# Patient Record
Sex: Male | Born: 1965
Health system: Southern US, Community
[De-identification: ages and names within clinical notes are randomized; demographics above are authoritative.]

## PROBLEM LIST (undated history)

## (undated) DIAGNOSIS — I10 Essential (primary) hypertension: Secondary | ICD-10-CM

## (undated) DIAGNOSIS — E785 Hyperlipidemia, unspecified: Secondary | ICD-10-CM

## (undated) HISTORY — PX: DECOMPRESSION FASCIOTOMY LEG: SUR403

## (undated) HISTORY — DX: Hyperlipidemia, unspecified: E78.5

## (undated) HISTORY — PX: SHOULDER SURGERY: SHX246

---

## 1998-06-25 ENCOUNTER — Emergency Department (HOSPITAL_COMMUNITY): Admission: EM | Admit: 1998-06-25 | Discharge: 1998-06-25 | Payer: Self-pay | Admitting: Family Medicine

## 1998-08-20 ENCOUNTER — Emergency Department (HOSPITAL_COMMUNITY): Admission: EM | Admit: 1998-08-20 | Discharge: 1998-08-20 | Payer: Self-pay | Admitting: Emergency Medicine

## 1999-07-12 ENCOUNTER — Encounter: Payer: Self-pay | Admitting: Emergency Medicine

## 1999-07-12 ENCOUNTER — Emergency Department (HOSPITAL_COMMUNITY): Admission: EM | Admit: 1999-07-12 | Discharge: 1999-07-12 | Payer: Self-pay | Admitting: Emergency Medicine

## 1999-10-19 ENCOUNTER — Other Ambulatory Visit: Admission: RE | Admit: 1999-10-19 | Discharge: 1999-10-19 | Payer: Self-pay | Admitting: Urology

## 2007-02-11 ENCOUNTER — Ambulatory Visit (HOSPITAL_BASED_OUTPATIENT_CLINIC_OR_DEPARTMENT_OTHER): Admission: RE | Admit: 2007-02-11 | Discharge: 2007-02-11 | Payer: Self-pay | Admitting: Orthopedic Surgery

## 2007-02-26 ENCOUNTER — Ambulatory Visit (HOSPITAL_BASED_OUTPATIENT_CLINIC_OR_DEPARTMENT_OTHER): Admission: RE | Admit: 2007-02-26 | Discharge: 2007-02-26 | Payer: Self-pay | Admitting: Orthopedic Surgery

## 2007-05-09 ENCOUNTER — Emergency Department (HOSPITAL_COMMUNITY): Admission: EM | Admit: 2007-05-09 | Discharge: 2007-05-10 | Payer: Self-pay | Admitting: Emergency Medicine

## 2010-01-23 ENCOUNTER — Encounter: Admission: RE | Admit: 2010-01-23 | Discharge: 2010-01-23 | Payer: Self-pay | Admitting: Occupational Medicine

## 2010-01-25 ENCOUNTER — Encounter: Payer: Self-pay | Admitting: Sports Medicine

## 2010-01-31 ENCOUNTER — Ambulatory Visit: Payer: Self-pay | Admitting: Sports Medicine

## 2010-01-31 ENCOUNTER — Encounter: Payer: Self-pay | Admitting: Family Medicine

## 2010-01-31 DIAGNOSIS — M25519 Pain in unspecified shoulder: Secondary | ICD-10-CM | POA: Insufficient documentation

## 2010-04-12 ENCOUNTER — Ambulatory Visit (HOSPITAL_BASED_OUTPATIENT_CLINIC_OR_DEPARTMENT_OTHER): Admission: RE | Admit: 2010-04-12 | Discharge: 2010-04-12 | Payer: Self-pay | Admitting: Orthopedic Surgery

## 2010-12-11 NOTE — Consult Note (Signed)
Summary: [facility]  [place] [facility]   Imported By: Marily Memos 01/31/2010 14:42:12  _____________________________________________________________________  External Attachment:    Type:   Image     Comment:   External Document

## 2010-12-11 NOTE — Assessment & Plan Note (Signed)
Summary: Evan Strickland   Vital Signs:  Patient profile:   45 year old male Height:      72 inches Weight:      225.38 pounds BMI:     30.68 Pulse rate:   98 / minute BP sitting:   173 / 98  (left arm)  Vitals Entered By: Terese Door CMA (January 31, 2010 2:02 PM) CC: NP-right shoulder injury Pain Assessment Patient in pain? yes     Location: shoulder Intensity: 5 Type: aching   CC:  NP-right shoulder injury.  History of Present Illness: DOI: 01/22/2010 Employer: [employer] Current Work Status: No work over head/shoulder level; limited use of right arm.  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 Initially felt some anterio-superior right shoulder pain but continued his shift. Does not recall a popping/tearing/subluxation/dislocation sensation. Severe pain  ~2 hours later. Has tried relafen, ice, and corticosteroid injection. Pain has decreased by 50% in the interim. Occasional night pain. Pain mostly on overhead activities. Occasional transient paresthesias w/o clear moderating factors. No neck pain. No upper extremity weakness. No prior right shoulder injuries or procedures.  Outside progress notes and x-ray report reviewed during this encounter. Respective documentation submitted for scanning into the EMR.   Outside shoulder x-rays WNL per report. PMH-FH-SH reviewed for relevance  Physical Exam  General:  Well-developed,well-nourished,in no acute distress; alert,appropriate and cooperative throughout examination Msk:  NECK: FROM without pain. No ttp.  SHOULDERS: FROM with limied 120 degress right flex/abd 2/2 pain. Full PROM. No apparent deformity. (+) ttp right ac jt and posterior shoulder. (-) cross arm adduction. (+) Neer's with relief. (+) hawkins (-) empty can No labral instability but pain on testing. (+)  yerguson's.  ELBOWS/WRISTS/HANDS/FINGERS: FROM without pain. Full strength.  Pulses:  2+ radial pulses. Neurologic:  (-) spurling's bilaterally. Normal nv examination throughout the upper extremities. Additional Exam:  Musculoskeletal Ultrasound: Longitudinal and transverse views of the shoulder revealed the following:  Biceps Tendon: Normal.  AC Joint: Normal. Subscapularis: Normal except for calcification mid body on dynamic testing. No impingement. Supraspinatus: Normal. No impingement. Infraspinatus: Normal. Teres Minor: Normal. Deltoid: Normal. Humeral Head: Normal. Labrum: Normal on posterior view.  *No bursitis.   Impression & Recommendations:  Problem # 1:  SHOULDER PAIN, RIGHT (ICD-719.41) Likely RC strain. No apparent rotatory cuff tear or labral tear. s/p corticosteroid injection at outside facility.  - Continue relafen regimen. - Remain on light work duty status for the next week. - Return to regular duty once 90% improved wrt pain and functionality. - RTC as needed for pain, failure to significantly improve, or any other concerns. If persistent pain, then will consider additional imaging +/- physical therapy to further evaluate his symptoms.  Orders: US EXTREMITY NON-VASC REAL-TIME IMG [phone])

## 2010-12-11 NOTE — Letter (Signed)
Summary: Out of Work  [facility]  [address]   [place], [place] [address]   Phone: [phone]  Fax: [phone]    January 31, 2010   Employee:  [employer]    To Whom It May Concern:  Mr. Honse should remain on his current light work duty status for an additional week. He can resume regular work duty once he has acheived 90% improvement with respect to his pain and functionality. Mr. Esquivel should return to our clinic as needed for pain, failure to significantly improve, or any other concerns.  If you need additional information, please feel free to contact our office.         Sincerely,    Valarie Merino MD

## 2011-01-28 LAB — POCT HEMOGLOBIN-HEMACUE: Hemoglobin: 15.6 g/dL (ref 13.0–17.0)

## 2011-03-29 NOTE — Op Note (Signed)
NAMEDAYTONA, RETANA NO.:  [id number]   MEDICAL RECORD NO.:  [id number]          PATIENT TYPE:  AMB   LOCATION:  DSC                          FACILITY:  [facility]   PHYSICIAN:  Loreta Ave, M.D. DATE OF BIRTH:  01/08/1966   DATE OF PROCEDURE:  02/26/2007  DATE OF DISCHARGE:                               OPERATIVE REPORT   PREOPERATIVE DIAGNOSIS:  Left leg status post anterior compartment  release and decompression of peroneal nerve with postoperative hematoma.   POSTOPERATIVE DIAGNOSIS:  Left leg status post anterior compartment  release and decompression of peroneal nerve with postoperative hematoma.   PROCEDURE:  Incision, drainage, evacuation, and lavage hematoma of right  leg.   SURGEON:  Loreta Ave, M.D.   ASSISTANT:  Genene Churn. Barry Dienes, P.A.-C.   ANESTHESIA:  General.   BLOOD LOSS:  Minimal.   TOURNIQUET TIME:  30 minutes.   SPECIMENS:  None.   CULTURES:  The hematoma was sent for aerobic and anaerobic culture but  no obvious purulence was seen.   COMPLICATIONS:  None.   DRESSING:  Soft compressive.   DESCRIPTION OF PROCEDURE:  The patient was brought to the operating room  and after adequate anesthesia had been obtained, a tourniquet was  applied on the upper aspect of the right leg.  Prepped and draped in the  usual sterile fashion.  Without the tourniquet initially, I opened the  incision over the distal third of the anterior compartment.  The skin  and subcutaneous tissues were divided.  Obvious subcutaneous hematoma  evacuated there.  Once that was cleared out, there was still residual  hematoma well proximally and I made a separate longitudinal incision  over the proximal aspect of the anterior compartment near the top of  this compartment release.  The skin and subcutaneous tissues were  divided there.  Neurovascular structures were protected throughout.  Hematoma evacuated there.  To facilitate exposure and inspection, I then  used an Esmarch and inflated the tourniquet to 300 mmHg.  When that was  complete, all wounds were thoroughly identified, completely cleared out  from hematoma of all recesses.  Although this was a little bit tense  from the hematoma, once it was evacuated, all the tissues were nice and  flexible.  No extension into any of the other compartments.  The  musculature in the anterior compartment looked excellent and viable  without any evidence of necrosis.  At no point was there any purulence.  The peroneal nerve, which had been decompressed distally, was still in  good condition.  Thorough irrigation with pulse lavage.  The wounds were then approximated with Vicryl and staples.  A sterile  compressive dressing was applied.  Tourniquet deflated and removed.  Anesthesia reversed.  Brought to the recovery room.  Tolerated surgery  well with no complications.      Loreta Ave, M.D.  Electronically Signed     DFM/MEDQ  D:  02/26/2007  T:  02/26/2007  Job:  [id number]

## 2011-03-29 NOTE — Op Note (Signed)
NAMEISAIAHS, CHANCY NO.:  [id number]   MEDICAL RECORD NO.:  [id number]          PATIENT TYPE:  AMB   LOCATION:  DSC                          FACILITY:  [facility]   PHYSICIAN:  Loreta Ave, M.D. DATE OF BIRTH:  October 14, 1966   DATE OF PROCEDURE:  02/11/2007  DATE OF DISCHARGE:                               OPERATIVE REPORT   PREOPERATIVE DIAGNOSIS:  Symptomatic post-traumatic fascial hernia,  anterior lateral compartments, lower aspect, right leg.   POSTOPERATIVE DIAGNOSIS:  Traumatic fascial hernia lateral compartment  right leg with partial entrapment of peroneal nerve superficial branch  through the hernia.   PROCEDURE:  Exploration right leg with release of superficial branch of  peroneal nerve.  Fasciotomy lateral compartment proximally and distally.   SURGEON:  Loreta Ave, M.D.   ASSISTANT:  Zonia Kief, PA   ANESTHESIA:  General.   BLOOD LOSS:  Minimal.   SPECIMENS:  None.   CULTURES:  None.   COMPLICATIONS:  None.   DRESSING:  Soft compressive.   TOURNIQUET TIME:  40 minutes.   PROCEDURE:  The patient brought to the operating room, placed on  operating table in supine position.  After adequate anesthesia had been  obtained,  Tourniquet applied upper aspect, right leg.  Prepped and  draped in the usual sterile fashion.  Exsanguinated with elevation and  Esmarch.  Tourniquet inflated to 350 mm mercury.  Longitudinal incision junction  of the middle and distal third of the leg centered over the  intramuscular septum just lateral to top of the fascial defect.  Skin  and subcutaneous tissue divided.  Defect was evident almost immediately  about 5 to 6 mm long with soft tissue swelling in the defect.  It was  just posterior to the inner muscular septum.  Superficial branch of  peroneal nerve actually had herniated through this up and then back down  with some scarring and inflammatory tissue around this.  Nerve  identified and protected  throughout.  I then carefully exposed the  fascia above and below, went proximal and distal and did a complete  release of lateral compartment with direct visualization in a  subcutaneous manner well proximal and distally out the bottom of the  compartment.  Nerve protected throughout.  Thoroughly decompressed  throughout.  There was no need to release the anterior compartment as it  was not involved.  Wound irrigated.  Skin was closed with  subcutaneous and subcuticular Vicryl.  Margins of wound injected with  Marcaine.  Sterile compressive dressing applied.  Compressive hose  applied.  Tourniquet deflated and removed.  Anesthesia reversed.  Brought to recovery room.  Tolerated surgery well.  No complications.      Loreta Ave, M.D.  Electronically Signed     DFM/MEDQ  D:  02/11/2007  T:  02/11/2007  Job:  [id number]

## 2013-04-01 ENCOUNTER — Encounter (HOSPITAL_COMMUNITY): Payer: Self-pay | Admitting: Emergency Medicine

## 2013-04-01 ENCOUNTER — Encounter (HOSPITAL_COMMUNITY): Payer: Self-pay | Admitting: Anesthesiology

## 2013-04-01 ENCOUNTER — Encounter (HOSPITAL_COMMUNITY)
Admission: EM | Disposition: A | Discharge status: Home / Self Care | Payer: Self-pay | Source: Home / Self Care | Attending: Emergency Medicine

## 2013-04-01 ENCOUNTER — Emergency Department (HOSPITAL_COMMUNITY): Payer: 59 | Admitting: Anesthesiology

## 2013-04-01 ENCOUNTER — Emergency Department (HOSPITAL_COMMUNITY): Payer: 59

## 2013-04-01 ENCOUNTER — Observation Stay (HOSPITAL_COMMUNITY)
Admission: EM | Admit: 2013-04-01 | Discharge: 2013-04-02 | Disposition: A | Discharge status: Home / Self Care | Payer: 59 | Attending: General Surgery | Admitting: General Surgery

## 2013-04-01 DIAGNOSIS — K3580 Unspecified acute appendicitis: Principal | ICD-10-CM | POA: Diagnosis present

## 2013-04-01 DIAGNOSIS — I10 Essential (primary) hypertension: Secondary | ICD-10-CM | POA: Insufficient documentation

## 2013-04-01 DIAGNOSIS — R1033 Periumbilical pain: Secondary | ICD-10-CM | POA: Insufficient documentation

## 2013-04-01 DIAGNOSIS — K37 Unspecified appendicitis: Secondary | ICD-10-CM

## 2013-04-01 DIAGNOSIS — R1031 Right lower quadrant pain: Secondary | ICD-10-CM | POA: Insufficient documentation

## 2013-04-01 HISTORY — PX: APPENDECTOMY: SHX54

## 2013-04-01 HISTORY — PX: LAPAROSCOPIC APPENDECTOMY: SHX408

## 2013-04-01 HISTORY — DX: Essential (primary) hypertension: I10

## 2013-04-01 LAB — CBC WITH DIFFERENTIAL/PLATELET
Basophils Absolute: 0 10*3/uL (ref 0.0–0.1)
Basophils Relative: 0 % (ref 0–1)
Eosinophils Absolute: 0.1 10*3/uL (ref 0.0–0.7)
Eosinophils Relative: 1 % (ref 0–5)
HCT: 42.8 % (ref 39.0–52.0)
Hemoglobin: 15.1 g/dL (ref 13.0–17.0)
Lymphocytes Relative: 12 % (ref 12–46)
Lymphs Abs: 1.4 10*3/uL (ref 0.7–4.0)
MCH: 31.7 pg (ref 26.0–34.0)
MCHC: 35.3 g/dL (ref 30.0–36.0)
MCV: 89.7 fL (ref 78.0–100.0)
Monocytes Absolute: 1 10*3/uL (ref 0.1–1.0)
Monocytes Relative: 9 % (ref 3–12)
Neutro Abs: 9.1 10*3/uL — ABNORMAL HIGH (ref 1.7–7.7)
Neutrophils Relative %: 79 % — ABNORMAL HIGH (ref 43–77)
Platelets: 163 10*3/uL (ref 150–400)
RBC: 4.77 MIL/uL (ref 4.22–5.81)
RDW: 12.3 % (ref 11.5–15.5)
WBC: 11.6 10*3/uL — ABNORMAL HIGH (ref 4.0–10.5)

## 2013-04-01 LAB — COMPREHENSIVE METABOLIC PANEL
ALT: 41 U/L (ref 0–53)
AST: 35 U/L (ref 0–37)
Albumin: 4.5 g/dL (ref 3.5–5.2)
Alkaline Phosphatase: 48 U/L (ref 39–117)
BUN: 17 mg/dL (ref 6–23)
CO2: 22 mEq/L (ref 19–32)
Calcium: 10 mg/dL (ref 8.4–10.5)
Chloride: 97 mEq/L (ref 96–112)
Creatinine, Ser: 0.94 mg/dL (ref 0.50–1.35)
GFR calc Af Amer: >90 mL/min (ref >90)
GFR calc non Af Amer: >90 mL/min (ref >90)
Glucose, Bld: 108 mg/dL — ABNORMAL HIGH (ref 70–99)
Potassium: 4 mEq/L (ref 3.5–5.1)
Sodium: 135 mEq/L (ref 135–145)
Total Bilirubin: 0.9 mg/dL (ref 0.3–1.2)
Total Protein: 7.7 g/dL (ref 6.0–8.3)

## 2013-04-01 LAB — URINALYSIS, ROUTINE W REFLEX MICROSCOPIC
Bilirubin Urine: NEGATIVE
Glucose, UA: NEGATIVE mg/dL
Hgb urine dipstick: NEGATIVE
Specific Gravity, Urine: 1.019 (ref 1.005–1.030)
pH: 8 (ref 5.0–8.0)

## 2013-04-01 SURGERY — APPENDECTOMY, LAPAROSCOPIC
Anesthesia: General | Site: Abdomen | Wound class: Clean Contaminated

## 2013-04-01 MED ORDER — MIDAZOLAM HCL 5 MG/5ML IJ SOLN
INTRAMUSCULAR | Status: DC | PRN
  Administered 2013-04-01: 2 mg via INTRAVENOUS

## 2013-04-01 MED ORDER — HYDROMORPHONE HCL PF 1 MG/ML IJ SOLN
INTRAMUSCULAR | Status: AC
  Filled 2013-04-01: qty 1

## 2013-04-01 MED ORDER — SODIUM CHLORIDE 0.9 % IV SOLN
INTRAVENOUS | Status: DC
  Administered 2013-04-01: 18:00:00 via INTRAVENOUS

## 2013-04-01 MED ORDER — BENAZEPRIL HCL 10 MG PO TABS
10.0000 mg | ORAL_TABLET | Freq: Every day | ORAL | Status: DC
  Administered 2013-04-02: 10 mg via ORAL
  Filled 2013-04-01: qty 1

## 2013-04-01 MED ORDER — SODIUM CHLORIDE 0.9 % IR SOLN
Status: DC | PRN
  Administered 2013-04-01: 1000 mL

## 2013-04-01 MED ORDER — MORPHINE SULFATE 4 MG/ML IJ SOLN
4.0000 mg | Freq: Once | INTRAMUSCULAR | Status: AC
  Administered 2013-04-01: 4 mg via INTRAVENOUS
  Filled 2013-04-01: qty 1

## 2013-04-01 MED ORDER — FENTANYL CITRATE 0.05 MG/ML IJ SOLN
50.0000 ug | Freq: Once | INTRAMUSCULAR | Status: AC
  Administered 2013-04-01: 50 ug via INTRAVENOUS
  Filled 2013-04-01: qty 2

## 2013-04-01 MED ORDER — DEXTROSE 5 % IV SOLN
2.0000 g | INTRAVENOUS | Status: AC
  Administered 2013-04-01: 2 g via INTRAVENOUS
  Filled 2013-04-01: qty 2

## 2013-04-01 MED ORDER — GLYCOPYRROLATE 0.2 MG/ML IJ SOLN
INTRAMUSCULAR | Status: DC | PRN
  Administered 2013-04-01: .8 mg via INTRAVENOUS

## 2013-04-01 MED ORDER — MORPHINE SULFATE 2 MG/ML IJ SOLN
1.0000 mg | INTRAMUSCULAR | Status: DC | PRN
  Administered 2013-04-01: 2 mg via INTRAVENOUS
  Filled 2013-04-01: qty 1

## 2013-04-01 MED ORDER — DEXAMETHASONE SODIUM PHOSPHATE 4 MG/ML IJ SOLN
INTRAMUSCULAR | Status: DC | PRN
  Administered 2013-04-01: 4 mg via INTRAVENOUS

## 2013-04-01 MED ORDER — PROPOFOL 10 MG/ML IV BOLUS
INTRAVENOUS | Status: DC | PRN
  Administered 2013-04-01: 200 mg via INTRAVENOUS

## 2013-04-01 MED ORDER — OXYCODONE HCL 5 MG PO TABS
5.0000 mg | ORAL_TABLET | ORAL | Status: DC | PRN
  Administered 2013-04-01 – 2013-04-02 (×3): 5 mg via ORAL
  Filled 2013-04-01 (×3): qty 1

## 2013-04-01 MED ORDER — ONDANSETRON HCL 4 MG/2ML IJ SOLN
4.0000 mg | Freq: Once | INTRAMUSCULAR | Status: AC
  Administered 2013-04-01: 4 mg via INTRAVENOUS
  Filled 2013-04-01: qty 2

## 2013-04-01 MED ORDER — IOHEXOL 300 MG/ML  SOLN
100.0000 mL | Freq: Once | INTRAMUSCULAR | Status: AC | PRN
  Administered 2013-04-01: 100 mL via INTRAVENOUS

## 2013-04-01 MED ORDER — NEOSTIGMINE METHYLSULFATE 1 MG/ML IJ SOLN
INTRAMUSCULAR | Status: DC | PRN
  Administered 2013-04-01: 5 mg via INTRAVENOUS

## 2013-04-01 MED ORDER — ACETAMINOPHEN 325 MG PO TABS
650.0000 mg | ORAL_TABLET | Freq: Four times a day (QID) | ORAL | Status: DC | PRN
  Administered 2013-04-01: 650 mg via ORAL
  Filled 2013-04-01: qty 2

## 2013-04-01 MED ORDER — LACTATED RINGERS IV SOLN
INTRAVENOUS | Status: DC | PRN
  Administered 2013-04-01: 16:00:00 via INTRAVENOUS

## 2013-04-01 MED ORDER — MORPHINE SULFATE 2 MG/ML IJ SOLN
2.0000 mg | INTRAMUSCULAR | Status: DC | PRN
  Administered 2013-04-01 – 2013-04-02 (×4): 2 mg via INTRAVENOUS
  Filled 2013-04-01 (×4): qty 1

## 2013-04-01 MED ORDER — LIDOCAINE HCL (CARDIAC) 20 MG/ML IV SOLN
INTRAVENOUS | Status: DC | PRN
  Administered 2013-04-01: 60 mg via INTRAVENOUS

## 2013-04-01 MED ORDER — ONDANSETRON HCL 4 MG/2ML IJ SOLN
4.0000 mg | INTRAMUSCULAR | Status: DC | PRN
  Administered 2013-04-01: 4 mg via INTRAVENOUS
  Filled 2013-04-01: qty 2

## 2013-04-01 MED ORDER — FENTANYL CITRATE 0.05 MG/ML IJ SOLN
INTRAMUSCULAR | Status: DC | PRN
  Administered 2013-04-01: 50 ug via INTRAVENOUS
  Administered 2013-04-01: 100 ug via INTRAVENOUS
  Administered 2013-04-01 (×2): 50 ug via INTRAVENOUS

## 2013-04-01 MED ORDER — IOHEXOL 300 MG/ML  SOLN
25.0000 mL | INTRAMUSCULAR | Status: DC
  Administered 2013-04-01: 25 mL via ORAL

## 2013-04-01 MED ORDER — DEXTROSE 5 % IV SOLN
2.0000 g | Freq: Four times a day (QID) | INTRAVENOUS | Status: AC
  Administered 2013-04-01 – 2013-04-02 (×2): 2 g via INTRAVENOUS
  Filled 2013-04-01 (×2): qty 2

## 2013-04-01 MED ORDER — ROCURONIUM BROMIDE 100 MG/10ML IV SOLN
INTRAVENOUS | Status: DC | PRN
  Administered 2013-04-01: 30 mg via INTRAVENOUS

## 2013-04-01 MED ORDER — HYDROMORPHONE HCL PF 1 MG/ML IJ SOLN
0.2500 mg | INTRAMUSCULAR | Status: DC | PRN
  Administered 2013-04-01 (×4): 0.5 mg via INTRAVENOUS

## 2013-04-01 MED ORDER — BUPIVACAINE-EPINEPHRINE 0.25% -1:200000 IJ SOLN
INTRAMUSCULAR | Status: DC | PRN
  Administered 2013-04-01: 30 mL

## 2013-04-01 MED ORDER — ACETAMINOPHEN 650 MG RE SUPP: 650.0000 mg | Freq: Four times a day (QID) | RECTAL | Status: DC | PRN

## 2013-04-01 MED ORDER — ONDANSETRON HCL 4 MG/2ML IJ SOLN: 4.0000 mg | Freq: Four times a day (QID) | INTRAMUSCULAR | Status: DC | PRN

## 2013-04-01 MED ORDER — SUCCINYLCHOLINE CHLORIDE 20 MG/ML IJ SOLN
INTRAMUSCULAR | Status: DC | PRN
  Administered 2013-04-01: 100 mg via INTRAVENOUS

## 2013-04-01 SURGICAL SUPPLY — 38 items
APPLIER CLIP ROT 10 11.4 M/L (STAPLE)
CANISTER SUCTION 2500CC (MISCELLANEOUS) ×2 IMPLANT
CHLORAPREP W/TINT 26ML (MISCELLANEOUS) ×2 IMPLANT
CLIP APPLIE ROT 10 11.4 M/L (STAPLE) IMPLANT
CLOTH BEACON ORANGE TIMEOUT ST (SAFETY) ×2 IMPLANT
COVER SURGICAL LIGHT HANDLE (MISCELLANEOUS) ×2 IMPLANT
CUTTER FLEX LINEAR 45M (STAPLE) ×2 IMPLANT
DERMABOND ADVANCED (GAUZE/BANDAGES/DRESSINGS) ×1
DERMABOND ADVANCED .7 DNX12 (GAUZE/BANDAGES/DRESSINGS) ×1 IMPLANT
DRAPE UTILITY 15X26 W/TAPE STR (DRAPE) ×4 IMPLANT
ELECT REM PT RETURN 9FT ADLT (ELECTROSURGICAL) ×2
ELECTRODE REM PT RTRN 9FT ADLT (ELECTROSURGICAL) ×1 IMPLANT
GLOVE BIO SURGEON STRL SZ7 (GLOVE) ×2 IMPLANT
GLOVE BIOGEL PI IND STRL 7.0 (GLOVE) ×2 IMPLANT
GLOVE BIOGEL PI IND STRL 7.5 (GLOVE) ×1 IMPLANT
GLOVE BIOGEL PI INDICATOR 7.0 (GLOVE) ×2
GLOVE BIOGEL PI INDICATOR 7.5 (GLOVE) ×1
GOWN STRL NON-REIN LRG LVL3 (GOWN DISPOSABLE) ×6 IMPLANT
KIT BASIN OR (CUSTOM PROCEDURE TRAY) ×2 IMPLANT
KIT ROOM TURNOVER OR (KITS) ×2 IMPLANT
NS IRRIG 1000ML POUR BTL (IV SOLUTION) ×2 IMPLANT
PAD ARMBOARD 7.5X6 YLW CONV (MISCELLANEOUS) ×4 IMPLANT
POUCH SPECIMEN RETRIEVAL 10MM (ENDOMECHANICALS) ×2 IMPLANT
RELOAD 45 VASCULAR/THIN (ENDOMECHANICALS) ×2 IMPLANT
RELOAD STAPLE TA45 3.5 REG BLU (ENDOMECHANICALS) ×2 IMPLANT
SCALPEL HARMONIC ACE (MISCELLANEOUS) ×2 IMPLANT
SCISSORS LAP 5X35 DISP (ENDOMECHANICALS) IMPLANT
SET IRRIG TUBING LAPAROSCOPIC (IRRIGATION / IRRIGATOR) ×2 IMPLANT
SLEEVE ENDOPATH XCEL 5M (ENDOMECHANICALS) ×2 IMPLANT
SPECIMEN JAR SMALL (MISCELLANEOUS) ×2 IMPLANT
SUT MNCRL AB 4-0 PS2 18 (SUTURE) ×4 IMPLANT
SUT VICRYL 0 UR6 27IN ABS (SUTURE) ×2 IMPLANT
TOWEL OR 17X24 6PK STRL BLUE (TOWEL DISPOSABLE) ×2 IMPLANT
TOWEL OR 17X26 10 PK STRL BLUE (TOWEL DISPOSABLE) ×2 IMPLANT
TRAY FOLEY CATH 14FR (SET/KITS/TRAYS/PACK) ×2 IMPLANT
TRAY LAPAROSCOPIC (CUSTOM PROCEDURE TRAY) ×2 IMPLANT
TROCAR XCEL BLUNT TIP 100MML (ENDOMECHANICALS) ×2 IMPLANT
TROCAR XCEL NON-BLD 5MMX100MML (ENDOMECHANICALS) ×2 IMPLANT

## 2013-04-01 NOTE — ED Notes (Signed)
Pt made aware of need for urine sample.  

## 2013-04-01 NOTE — H&P (Signed)
Evan Strickland [birth year]  [id number].   Chief Complaint/Reason for Consult: acute appendicitis HPI: This is a 47 yo male with HTN who began having periumbilical pain that woke him up last night.  Throughout the night the patient noticed that his pain was migrating into his RLQ.  He had some mild nausea this morning, but no emesis or diarrhea.  He denies any fevers.  He came to [facility] where he had a CT scan that revealed acute appendicitis.  We have been called to see him.   Review of Systems: Please see HPI, otherwise all other systems have been reviewed and are negative.  History reviewed. No pertinent family history.  Past Medical History  Diagnosis Date  . Hypertension     Past Surgical History  Procedure Laterality Date  . Shoulder surgery Bilateral   . Decompression fasciotomy leg Right     Social History:  reports that he has never smoked. He does not have any smokeless tobacco history on file. He reports that  drinks alcohol. He reports that he does not use illicit drugs.  Allergies: No Known Allergies   (Not in a hospital admission)  Blood pressure 119/80, pulse 72, temperature 97.7 F (36.5 C), temperature source Oral, resp. rate 18, SpO2 97.00%. Physical Exam: General: pleasant, WD, WN white male who is laying in bed in NAD HEENT: head is normocephalic, atraumatic.  Sclera are noninjected.  PERRL.  Ears and nose without any masses or lesions.  Mouth is pink and moist Heart: regular, rate, and rhythm.  Normal s1,s2. No obvious murmurs, gallops, or rubs noted.  Palpable radial and pedal pulses bilaterally Lungs: CTAB, no wheezes, rhonchi, or rales noted.  Respiratory effort nonlabored Abd: soft, tender in RLQ at McBurney's point, ND, +BS, no masses, hernias, or organomegaly MS: all 4 extremities are symmetrical with no cyanosis, clubbing, or edema. Scars noted on his shoulders as well as his right lower leg. Skin: warm and dry with no masses, lesions, or rashes Psych: A&Ox3  with an appropriate affect.    Results for orders placed during the hospital encounter of 04/01/13 (from the past 48 hour(s))  CBC WITH DIFFERENTIAL     Status: Abnormal   Collection Time    04/01/13 11:55 AM      Result Value Range   WBC 11.6 (*) 4.0 - 10.5 K/uL   RBC 4.77  4.22 - 5.81 MIL/uL   Hemoglobin 15.1  13.0 - 17.0 g/dL   HCT 10.2  72.5 - 36.6 %   MCV 89.7  78.0 - 100.0 fL   MCH 31.7  26.0 - 34.0 pg   MCHC 35.3  30.0 - 36.0 g/dL   RDW 44.0  34.7 - 42.5 %   Platelets 163  150 - 400 K/uL   Neutrophils Relative % 79 (*) 43 - 77 %   Neutro Abs 9.1 (*) 1.7 - 7.7 K/uL   Lymphocytes Relative 12  12 - 46 %   Lymphs Abs 1.4  0.7 - 4.0 K/uL   Monocytes Relative 9  3 - 12 %   Monocytes Absolute 1.0  0.1 - 1.0 K/uL   Eosinophils Relative 1  0 - 5 %   Eosinophils Absolute 0.1  0.0 - 0.7 K/uL   Basophils Relative 0  0 - 1 %   Basophils Absolute 0.0  0.0 - 0.1 K/uL  COMPREHENSIVE METABOLIC PANEL     Status: Abnormal   Collection Time    04/01/13 11:55 AM  Result Value Range   Sodium 135  135 - 145 mEq/L   Potassium 4.0  3.5 - 5.1 mEq/L   Chloride 97  96 - 112 mEq/L   CO2 22  19 - 32 mEq/L   Glucose, Bld 108 (*) 70 - 99 mg/dL   BUN 17  6 - 23 mg/dL   Creatinine, Ser 9.14  0.50 - 1.35 mg/dL   Calcium 78.2  8.4 - 95.6 mg/dL   Total Protein 7.7  6.0 - 8.3 g/dL   Albumin 4.5  3.5 - 5.2 g/dL   AST 35  0 - 37 U/L   ALT 41  0 - 53 U/L   Alkaline Phosphatase 48  39 - 117 U/L   Total Bilirubin 0.9  0.3 - 1.2 mg/dL   GFR calc non Af Amer >90  >90 mL/min   GFR calc Af Amer >90  >90 mL/min   Comment:            The eGFR has been calculated     using the CKD EPI equation.     This calculation has not been     validated in all clinical     situations.     eGFR's persistently     <90 mL/min signify     possible Chronic Kidney Disease.  URINALYSIS, ROUTINE W REFLEX MICROSCOPIC     Status: Abnormal   Collection Time    04/01/13  1:09 PM      Result Value Range   Color, Urine  YELLOW  YELLOW   APPearance CLEAR  CLEAR   Specific Gravity, Urine 1.019  1.005 - 1.030   pH 8.0  5.0 - 8.0   Glucose, UA NEGATIVE  NEGATIVE mg/dL   Hgb urine dipstick NEGATIVE  NEGATIVE   Bilirubin Urine NEGATIVE  NEGATIVE   Ketones, ur 15 (*) NEGATIVE mg/dL   Protein, ur NEGATIVE  NEGATIVE mg/dL   Urobilinogen, UA 0.2  0.0 - 1.0 mg/dL   Nitrite NEGATIVE  NEGATIVE   Leukocytes, UA NEGATIVE  NEGATIVE   Comment: MICROSCOPIC NOT DONE ON URINES WITH NEGATIVE PROTEIN, BLOOD, LEUKOCYTES, NITRITE, OR GLUCOSE <1000 mg/dL.   Ct Abdomen Pelvis W Contrast  04/01/2013   *RADIOLOGY REPORT*  Clinical Data: Worsening abdominal pain radiating to back now with a sharp right lower quadrant pain and nausea question appendicitis  CT ABDOMEN AND PELVIS WITH CONTRAST  Technique:  Multidetector CT imaging of the abdomen and pelvis was performed following the standard protocol during bolus administration of intravenous contrast. Sagittal and coronal MPR images reconstructed from axial data set.  Contrast: OMNIPAQUE IOHEXOL 300 MG/ML  SOLN Dilute oral contrast.  Comparison: None  Findings: Lung bases clear. Liver, spleen, pancreas, kidneys, and adrenal glands normal appearance. Appendix demonstrates a slightly thickened wall with increased enhancement with mild surrounding periappendiceal inflammatory changes compatible with acute appendicitis. No evidence of perforation or abscess. Appendix lies immediately anterior to the right psoas muscle.  Few sigmoid diverticula noted. Otherwise normal appearance of stomach and bowel loops. No mass, adenopathy, free fluid, or free air. No hernia or acute bony lesion.  IMPRESSION: Uncomplicated acute appendicitis. Minimal sigmoid diverticulosis.  Findings called to Dr. Anitra Lauth on 04/01/2013 at 1405 hours.   Original Report Authenticated By: Ulyses Southward, M.D.       Assessment/Plan 1. Acute appendicitis 2. HTN  Plan: 1. The patient has acute appendicitis and will need to  go to the operating room for an appendectomy.  The procedure along with risks,  complications, and expected recovery has been thoroughly explained to the patient by myself as well as Dr. Dwain Sarna.  He is agreeable to proceed to the operating room.  Lelend Heinecke E 04/01/2013, 3:17 PM Pager: [phone]

## 2013-04-01 NOTE — ED Notes (Signed)
Pt finished drinking 1st cup of contrast. CT notified. 

## 2013-04-01 NOTE — Anesthesia Postprocedure Evaluation (Signed)
  Anesthesia Post-op Note  Patient: Evan Strickland  Procedure(s) Performed: Procedure(s): APPENDECTOMY LAPAROSCOPIC (N/A)  Patient Location: PACU  Anesthesia Type:General  Level of Consciousness: awake  Airway and Oxygen Therapy: Patient Spontanous Breathing  Post-op Pain: mild  Post-op Assessment: Post-op Vital signs reviewed  Post-op Vital Signs: Reviewed  Complications: No apparent anesthesia complications

## 2013-04-01 NOTE — Transfer of Care (Signed)
Immediate Anesthesia Transfer of Care Note  Patient: Evan Strickland  Procedure(s) Performed: Procedure(s): APPENDECTOMY LAPAROSCOPIC (N/A)  Patient Location: PACU  Anesthesia Type:General  Level of Consciousness: awake, alert  and oriented  Airway & Oxygen Therapy: Patient Spontanous Breathing and Patient connected to nasal cannula oxygen  Post-op Assessment: Report given to PACU RN  Post vital signs: Reviewed and stable  Complications: No apparent anesthesia complications

## 2013-04-01 NOTE — Anesthesia Preprocedure Evaluation (Signed)
Anesthesia Evaluation  Patient identified by MRN, date of birth, ID band Patient awake    Reviewed: Allergy & Precautions, H&P , NPO status , Patient's Chart, lab work & pertinent test results  Airway Mallampati: II      Dental   Pulmonary          Cardiovascular hypertension, Pt. on medications     Neuro/Psych    GI/Hepatic Neg liver ROS, History of abdominal pain. CE   Endo/Other  negative endocrine ROS  Renal/GU negative Renal ROS     Musculoskeletal   Abdominal   Peds  Hematology   Anesthesia Other Findings   Reproductive/Obstetrics                           Anesthesia Physical Anesthesia Plan  ASA: II  Anesthesia Plan: General   Post-op Pain Management:    Induction: Intravenous  Airway Management Planned: Oral ETT  Additional Equipment:   Intra-op Plan:   Post-operative Plan: Extubation in OR  Informed Consent: I have reviewed the patients History and Physical, chart, labs and discussed the procedure including the risks, benefits and alternatives for the proposed anesthesia with the patient or authorized representative who has indicated his/her understanding and acceptance.   Dental advisory given  Plan Discussed with: CRNA, Anesthesiologist and Surgeon  Anesthesia Plan Comments:         Anesthesia Quick Evaluation

## 2013-04-01 NOTE — ED Notes (Signed)
Pt returned from radiology.

## 2013-04-01 NOTE — ED Provider Notes (Signed)
History     CSN: [id number]  Arrival date & time 04/01/13  1133   First MD Initiated Contact with Patient 04/01/13 1138      Chief Complaint  Patient presents with  . Abdominal Pain    (Consider location/radiation/quality/duration/timing/severity/associated sxs/prior treatment) HPI Comments: Patient stated some mild stomach upset yesterday but no true pain until 5 AM this morning which started out as a dull ache and became more severe once he arrived at work. The pain initially started in the periumbilical region and moved to the right lower quadrant. No radiation of pain to the back.  Denies any urinary symptoms, hematuria, vomiting or diarrhea. Does complain of nausea which started in the last few hours. No prior surgical history.  Patient is a 47 y.o. male presenting with abdominal pain. The history is provided by the patient.  Abdominal Pain This is a new problem. The current episode started 12 to 24 hours ago. The problem occurs constantly. The problem has been gradually worsening. Associated symptoms include abdominal pain. The symptoms are aggravated by walking and bending. Relieved by: Lying still. He has tried nothing for the symptoms. The treatment provided no relief.    Past Medical History  Diagnosis Date  . Hypertension     Past Surgical History  Procedure Laterality Date  . Shoulder surgery Bilateral     No family history on file.  History  Substance Use Topics  . Smoking status: Not on file  . Smokeless tobacco: Not on file  . Alcohol Use: Not on file      Review of Systems  Constitutional: Negative for fever.  Gastrointestinal: Positive for nausea and abdominal pain. Negative for vomiting, diarrhea and constipation.  Genitourinary: Negative for dysuria, frequency and hematuria.  All other systems reviewed and are negative.    Allergies  Review of patient's allergies indicates no known allergies.  Home Medications  No current outpatient  prescriptions on file.  BP 143/99  Pulse 88  Temp(Src) 97.7 F (36.5 C) (Oral)  Resp 20  SpO2 99%  Physical Exam  Nursing note and vitals reviewed. Constitutional: He is oriented to person, place, and time. He appears well-developed and well-nourished. No distress.  HENT:  Head: Normocephalic and atraumatic.  Mouth/Throat: Oropharynx is clear and moist.  Eyes: Conjunctivae and EOM are normal. Pupils are equal, round, and reactive to light.  Neck: Normal range of motion. Neck supple.  Cardiovascular: Normal rate, regular rhythm and intact distal pulses.   No murmur heard. Pulmonary/Chest: Effort normal and breath sounds normal. No respiratory distress. He has no wheezes. He has no rales.  Abdominal: Soft. Normal appearance. He exhibits no distension. There is tenderness in the right lower quadrant. There is rebound, guarding and tenderness at McBurney's point. There is no CVA tenderness. No hernia.  Musculoskeletal: Normal range of motion. He exhibits no edema and no tenderness.  Neurological: He is alert and oriented to person, place, and time.  Skin: Skin is warm and dry. No rash noted. No erythema.  Psychiatric: He has a normal mood and affect. His behavior is normal.    ED Course  Procedures (including critical care time)  Labs Reviewed  CBC WITH DIFFERENTIAL - Abnormal; Notable for the following:    WBC 11.6 (*)    Neutrophils Relative % 79 (*)    Neutro Abs 9.1 (*)    All other components within normal limits  COMPREHENSIVE METABOLIC PANEL - Abnormal; Notable for the following:    Glucose, Bld 108 (*)  All other components within normal limits  URINALYSIS, ROUTINE W REFLEX MICROSCOPIC - Abnormal; Notable for the following:    Ketones, ur 15 (*)    All other components within normal limits   Ct Abdomen Pelvis W Contrast  04/01/2013   *RADIOLOGY REPORT*  Clinical Data: Worsening abdominal pain radiating to back now with a sharp right lower quadrant pain and nausea  question appendicitis  CT ABDOMEN AND PELVIS WITH CONTRAST  Technique:  Multidetector CT imaging of the abdomen and pelvis was performed following the standard protocol during bolus administration of intravenous contrast. Sagittal and coronal MPR images reconstructed from axial data set.  Contrast: OMNIPAQUE IOHEXOL 300 MG/ML  SOLN Dilute oral contrast.  Comparison: None  Findings: Lung bases clear. Liver, spleen, pancreas, kidneys, and adrenal glands normal appearance. Appendix demonstrates a slightly thickened wall with increased enhancement with mild surrounding periappendiceal inflammatory changes compatible with acute appendicitis. No evidence of perforation or abscess. Appendix lies immediately anterior to the right psoas muscle.  Few sigmoid diverticula noted. Otherwise normal appearance of stomach and bowel loops. No mass, adenopathy, free fluid, or free air. No hernia or acute bony lesion.  IMPRESSION: Uncomplicated acute appendicitis. Minimal sigmoid diverticulosis.  Findings called to Dr. Anitra Lauth on 04/01/2013 at 1405 hours.   Original Report Authenticated By: Ulyses Southward, M.D.     1. Appendicitis       MDM   Patient with complaints of abdominal pain most concerning for appendicitis. Pain started in the periumbilical region early today and has moved to the right lower quadrant with guarding and tenderness in the right lower quadrant. He denies any urinary symptoms, vomiting but has had some mild nausea. No prior surgical history. Patient given fentanyl and Zofran prior to my examination and it is starting to improve his pain.  CBC, CMP, UA, CT of abdomen and pelvis pending  3:10 PM CT showed appendicitis      Gwyneth Sprout, MD 04/01/13 1510

## 2013-04-01 NOTE — ED Notes (Signed)
Pt c/o right lower quadrant abd pain, sts it started early this morning more around umbilicus and progressively move down and right. Pt also c/o nausea, denies vomiting and diarrhea. Pain worse when standing up. Hasn't taken any pain medications today.

## 2013-04-01 NOTE — Op Note (Signed)
Preoperative diagnosis: Acute appendicitis Postoperative diagnosis: same as above Procedure: Laparoscopic appendectomy Surgeon: Dr Harden Mo Estimated blood loss: Minimal Anesthesia: Gen. Endotracheal Specimens: Appendix to pathology Complications: None Drains: None Sponge and needle count correct x2 an operation Disposition to recovery stable  Indications: This a 47 year old healthy male who presents with right lower quadrant pain, elevated white blood cell count, and an exam and a CT scan consistent with acute appendicitis. We discussed proceeding the operating room for laparoscopic appendectomy.  Procedure: After informed consent was obtained the patient was taken to the operating room. He was administered 2 g of intravenous cefoxitin. Sequential compression devices on his legs for DVT prophylaxis. He is placed under general anesthesia without complication. A Foley catheter was placed. His left arm was tucked appropriately padded. His abdomen was prepped and draped in the standard sterile surgical fashion. Surgical timeout was performed.  I infiltrated marcaine below his umbilicus. I made a vertical incision. I entered his fascia sharply. The peritoneum was entered bluntly. I placed a 0 Vicryl pursestring suture through the fascia. A Hasson trocar was then introduced and the abdomen was insufflated to 15 mmHg pressure. I placed an additional 5 mm trocar in the suprapubic region under direct vision. One other trocar was placed in the left lower quadrant under direct vision without complication. His appendix was noted to have acute appendicitis. I then released these appendix from the white line with the Harmonic scalpel. I then divided the appendiceal mesentery with the harmonic scalpel after encircling the base of the [place]. I then inserted the GIA stapler with a blue load and divided the appendix at its junction with the cecum. This was then placed in an Endo Catch bag and  removed. This was hemostatic. I irrigated. This was all clear. I then removed my umbilical trocar and tied my stitch down. I placed 1 additional 0 Vicryl figure-of-eight suture this completely obliterated the defect. The abdomen was desufflated and trocars were removed. I closed these with 4-0 Monocryl and Dermabond. He was extubated in the operative room and transferred to recovery stable.

## 2013-04-01 NOTE — H&P (Signed)
Agree with above, he has acute appendicitis on exam and by ct scan with elevated wbc.  Will plan on lap appy. Risks/benefits/indications discussed

## 2013-04-01 NOTE — Preoperative (Signed)
Beta Blockers   Reason not to administer Beta Blockers:Not Applicable 

## 2013-04-01 NOTE — Anesthesia Procedure Notes (Signed)
Procedure Name: Intubation Date/Time: 04/01/2013 4:04 PM Performed by: Lovie Chol Pre-anesthesia Checklist: Patient identified, Emergency Drugs available, Suction available, Patient being monitored and Timeout performed Patient Re-evaluated:Patient Re-evaluated prior to inductionOxygen Delivery Method: Circle system utilized Preoxygenation: Pre-oxygenation with 100% oxygen Intubation Type: IV induction, Cricoid Pressure applied and Rapid sequence Laryngoscope Size: Miller and 2 Grade View: Grade I Tube type: Oral Tube size: 7.5 mm Number of attempts: 1 Airway Equipment and Method: Stylet Placement Confirmation: ETT inserted through vocal cords under direct vision,  positive ETCO2,  CO2 detector and breath sounds checked- equal and bilateral Secured at: 23 cm Tube secured with: Tape Dental Injury: Teeth and Oropharynx as per pre-operative assessment

## 2013-04-02 ENCOUNTER — Encounter (HOSPITAL_COMMUNITY): Payer: Self-pay | Admitting: General Surgery

## 2013-04-02 MED ORDER — OXYCODONE HCL 5 MG PO TABS: 5.0000 mg | ORAL_TABLET | ORAL | Status: DC | PRN

## 2013-04-02 MED ORDER — KETOROLAC TROMETHAMINE 30 MG/ML IJ SOLN
30.0000 mg | Freq: Once | INTRAMUSCULAR | Status: AC
  Administered 2013-04-02: 30 mg via INTRAVENOUS
  Filled 2013-04-02: qty 1

## 2013-04-02 MED ORDER — OXYCODONE HCL 5 MG PO TABS
5.0000 mg | ORAL_TABLET | ORAL | Status: DC | PRN
  Administered 2013-04-02: 5 mg via ORAL
  Administered 2013-04-02: 10 mg via ORAL
  Filled 2013-04-02: qty 1
  Filled 2013-04-02: qty 2

## 2013-04-02 NOTE — Progress Notes (Signed)
Utilization Review Completed.   Demarrio Menges, RN, BSN Nurse Case Manager  [phone]  

## 2013-04-02 NOTE — Discharge Summary (Signed)
Agree with above 

## 2013-04-02 NOTE — Progress Notes (Signed)
DC instructions & medications reviewed with pt with wife at bedside. Peripheral IV site in RAC removed & clean dry drsg applied. Denies questions or concerns. Prescriptions given & transport called for wheelchair.

## 2013-04-02 NOTE — Discharge Summary (Signed)
Patient ID: Evan Strickland MRN: [id number] DOB/AGE: [birth year] 47 y.o.  Admit date: 04/01/2013 Discharge date: 04/02/2013  Procedures: laparoscopic appendectomy  Consults: None  Reason for Admission: This is a 47 yo male with HTN who began having periumbilical pain that woke him up last night. Throughout the night the patient noticed that his pain was migrating into his RLQ. He had some mild nausea this morning, but no emesis or diarrhea. He denies any fevers. He came to [facility] where he had a CT scan that revealed acute appendicitis. We have been called to see him.    Admission Diagnoses:  1. Acute appendicitis 2. HTN  Hospital Course: The patient was admitted and taken to the OR where he underwent a straight forward lap appy.  He tolerated the procedure well and on POD# 1, he was tolerating a regular diet and his pain was controlled on oral pain meds.  He was stable for dc home,  PE: Abd: soft, appropriately tender, +BS, ND, incisions c/d/i  Discharge Diagnoses:  Principal Problem:   Acute appendicitis s/p lap appy HTN  Discharge Medications:   Medication List    TAKE these medications       aspirin EC 81 MG tablet  Take 81 mg by mouth daily.     benazepril 10 MG tablet  Commonly known as:  LOTENSIN  Take 10 mg by mouth daily.     fish oil-omega-3 fatty acids 1000 MG capsule  Take 1 g by mouth 2 (two) times daily.     oxyCODONE 5 MG immediate release tablet  Commonly known as:  Oxy IR/ROXICODONE  Take 1-2 tablets (5-10 mg total) by mouth every 4 (four) hours as needed.        Discharge Instructions:     Follow-up Information   Follow up with [facility] On 04/20/2013. (11:45am, arrive at 11:15am)    Contact information:   [address]   [place] [address] [phone]       Signed: Letha Cape 04/02/2013, 9:03 AM

## 2013-04-20 ENCOUNTER — Ambulatory Visit (INDEPENDENT_AMBULATORY_CARE_PROVIDER_SITE_OTHER): Payer: 59 | Admitting: Internal Medicine

## 2013-04-20 ENCOUNTER — Encounter (INDEPENDENT_AMBULATORY_CARE_PROVIDER_SITE_OTHER): Payer: Self-pay | Admitting: Internal Medicine

## 2013-04-20 VITALS — BP 100/76 | HR 68 | Temp 97.3°F | Resp 14 | Ht 72.0 in | Wt 210.4 lb

## 2013-04-20 DIAGNOSIS — K3580 Unspecified acute appendicitis: Secondary | ICD-10-CM

## 2013-04-20 NOTE — Patient Instructions (Signed)
May resume regular activity without restrictions. Follow up as needed. Call with questions or concerns.  

## 2013-04-20 NOTE — Progress Notes (Signed)
  Subjective: Pt returns to the clinic today after undergoing laparoscopic appendectomy on 04/01/13 by Dr. Dwain Sarna.  The patient is tolerating their diet well and is having no severe pain.  Bowel function is good.  No problems with the wounds.  Objective: Vital signs in last 24 hours: Reviewed  PE: Abd: soft, non-tender, +bs, incisions well healed  Lab Results:  No results found for this basename: WBC, HGB, HCT, PLT,  in the last 72 hours BMET No results found for this basename: NA, K, CL, CO2, GLUCOSE, BUN, CREATININE, CALCIUM,  in the last 72 hours PT/INR No results found for this basename: LABPROT, INR,  in the last 72 hours CMP     Component Value Date/Time   NA 135 04/01/2013 1155   K 4.0 04/01/2013 1155   CL 97 04/01/2013 1155   CO2 22 04/01/2013 1155   GLUCOSE 108* 04/01/2013 1155   BUN 17 04/01/2013 1155   CREATININE 0.94 04/01/2013 1155   CALCIUM 10.0 04/01/2013 1155   PROT 7.7 04/01/2013 1155   ALBUMIN 4.5 04/01/2013 1155   AST 35 04/01/2013 1155   ALT 41 04/01/2013 1155   ALKPHOS 48 04/01/2013 1155   BILITOT 0.9 04/01/2013 1155   GFRNONAA >90 04/01/2013 1155   GFRAA >90 04/01/2013 1155   Lipase  No results found for this basename: lipase       Studies/Results: No results found.  Anti-infectives: Anti-infectives   None       Assessment/Plan  1.  S/P Laparoscopic Appendectomy: doing well, may resume regular activity without restrictions, Pt will follow up with [place] PRN and knows to call with questions or concerns.     Evan Strickland 04/20/2013

## 2013-06-09 ENCOUNTER — Ambulatory Visit: Payer: Self-pay

## 2013-06-09 ENCOUNTER — Other Ambulatory Visit: Payer: Self-pay | Admitting: Occupational Medicine

## 2013-06-09 DIAGNOSIS — R52 Pain, unspecified: Secondary | ICD-10-CM

## 2013-07-12 ENCOUNTER — Other Ambulatory Visit: Payer: Self-pay | Admitting: Physician Assistant

## 2013-07-13 ENCOUNTER — Encounter: Payer: Self-pay | Admitting: Family Medicine

## 2013-07-13 NOTE — Telephone Encounter (Signed)
Medication refill for one time only.  Patient needs to be seen.  Letter sent for patient to call and schedule 

## 2013-08-09 ENCOUNTER — Other Ambulatory Visit: Payer: Self-pay | Admitting: Family Medicine

## 2013-10-04 ENCOUNTER — Ambulatory Visit (INDEPENDENT_AMBULATORY_CARE_PROVIDER_SITE_OTHER): Payer: 59 | Admitting: Family Medicine

## 2013-10-04 ENCOUNTER — Encounter: Payer: Self-pay | Admitting: Family Medicine

## 2013-10-04 VITALS — BP 116/78 | HR 84 | Temp 98.7°F | Resp 18 | Ht 72.0 in | Wt 223.0 lb

## 2013-10-04 DIAGNOSIS — J019 Acute sinusitis, unspecified: Secondary | ICD-10-CM

## 2013-10-04 DIAGNOSIS — J069 Acute upper respiratory infection, unspecified: Secondary | ICD-10-CM

## 2013-10-04 MED ORDER — AMOXICILLIN 500 MG PO CAPS: 500.0000 mg | ORAL_CAPSULE | Freq: Three times a day (TID) | ORAL | Status: DC

## 2013-10-04 MED ORDER — HYDROCOD POLST-CHLORPHEN POLST 10-8 MG/5ML PO LQCR: 5.0000 mL | Freq: Two times a day (BID) | ORAL | Status: DC | PRN

## 2013-10-04 NOTE — Progress Notes (Signed)
  Subjective:    Patient ID: Evan Strickland, male    DOB: Apr 30, 1966, 47 y.o.   MRN: [id number]  HPI  Pt here with head and chest congestion for past few weeks . Sinus pressure and drainage has been worsening, thick mucous. + few days of fever last week. Cough is worse at night, has used OTC medications with little help. Unable to rest at night No SOB, no CP   Review of Systems  GEN- denies fatigue,+ fever, weight loss,weakness, recent illness HEENT- denies eye drainage, change in vision,+ nasal discharge, CVS- denies chest pain, palpitations RESP- denies SOB, +cough, wheeze ABD- denies N/V, change in stools, abd pain GU- denies dysuria, hematuria, dribbling, incontinence MSK- denies joint pain, muscle aches, injury Neuro- denies headache, dizziness, syncope, seizure activity      Objective:   Physical Exam GEN- NAD, alert and oriented x3 HEENT- PERRL, EOMI, non injected sclera, pink conjunctiva, MMM, oropharynx mild injection, TM clear bilat no effusion,  + maxillary sinus tenderness, inflammed turbinates,  Nasal drainage  Neck- Supple, no LAD CVS- RRR, no murmur RESP-CTAB EXT- No edema Pulses- Radial 2+         Assessment & Plan:

## 2013-10-04 NOTE — Patient Instructions (Signed)
Sinusitis and Upper respiratory infection Take antibiotics as prescribed Take cough medication Push fluids Tylenol or ibuprofen for fever F/U January for your physical

## 2013-10-05 DIAGNOSIS — J069 Acute upper respiratory infection, unspecified: Secondary | ICD-10-CM | POA: Insufficient documentation

## 2013-10-05 DIAGNOSIS — J019 Acute sinusitis, unspecified: Secondary | ICD-10-CM | POA: Insufficient documentation

## 2013-10-05 NOTE — Assessment & Plan Note (Signed)
Cough medication given, Treat sinuses due to prolonged symptoms and worsening

## 2013-10-05 NOTE — Assessment & Plan Note (Signed)
Treat with amox Mucinex

## 2014-02-07 ENCOUNTER — Other Ambulatory Visit: Payer: Self-pay | Admitting: Family Medicine

## 2014-05-13 ENCOUNTER — Other Ambulatory Visit: Payer: Self-pay | Admitting: Family Medicine

## 2014-06-16 ENCOUNTER — Other Ambulatory Visit: Payer: Self-pay | Admitting: Family Medicine

## 2014-06-28 ENCOUNTER — Telehealth: Payer: Self-pay | Admitting: Family Medicine

## 2014-06-28 MED ORDER — BENAZEPRIL HCL 10 MG PO TABS: ORAL_TABLET | ORAL | Status: DC

## 2014-06-28 NOTE — Telephone Encounter (Signed)
Patient made appt to see dr Dennard Schaumann on 08-31 for cpe. He would like to know if he could get refill on his benazepril until then  Pharmacy is [facility] [address]  [phone] Or [phone] are the best numbers to reach patient

## 2014-06-28 NOTE — Telephone Encounter (Signed)
Med sent to pharm and pt aware 

## 2014-07-07 ENCOUNTER — Other Ambulatory Visit: Payer: 59

## 2014-07-07 DIAGNOSIS — I10 Essential (primary) hypertension: Secondary | ICD-10-CM

## 2014-07-07 DIAGNOSIS — Z5181 Encounter for therapeutic drug level monitoring: Secondary | ICD-10-CM

## 2014-07-07 DIAGNOSIS — Z0000 Encounter for general adult medical examination without abnormal findings: Secondary | ICD-10-CM

## 2014-07-07 LAB — COMPLETE METABOLIC PANEL WITH GFR
ALBUMIN: 4.7 g/dL (ref 3.5–5.2)
ALT: 48 U/L (ref 0–53)
AST: 30 U/L (ref 0–37)
Alkaline Phosphatase: 44 U/L (ref 39–117)
BUN: 15 mg/dL (ref 6–23)
CALCIUM: 9.7 mg/dL (ref 8.4–10.5)
CHLORIDE: 102 meq/L (ref 96–112)
CO2: 28 mEq/L (ref 19–32)
CREATININE: 1.04 mg/dL (ref 0.50–1.35)
GFR, EST NON AFRICAN AMERICAN: 86 mL/min
GFR, Est African American: >89 mL/min
GLUCOSE: 100 mg/dL — AB (ref 70–99)
Potassium: 4.6 mEq/L (ref 3.5–5.3)
Sodium: 139 mEq/L (ref 135–145)
Total Bilirubin: 0.8 mg/dL (ref 0.2–1.2)
Total Protein: 7.4 g/dL (ref 6.0–8.3)

## 2014-07-07 LAB — CBC WITH DIFFERENTIAL/PLATELET
BASOS ABS: 0 10*3/uL (ref 0.0–0.1)
BASOS PCT: 0 % (ref 0–1)
EOS PCT: 4 % (ref 0–5)
Eosinophils Absolute: 0.2 10*3/uL (ref 0.0–0.7)
HEMATOCRIT: 44.3 % (ref 39.0–52.0)
HEMOGLOBIN: 15.5 g/dL (ref 13.0–17.0)
LYMPHS PCT: 31 % (ref 12–46)
Lymphs Abs: 1.6 10*3/uL (ref 0.7–4.0)
MCH: 32 pg (ref 26.0–34.0)
MCHC: 35 g/dL (ref 30.0–36.0)
MCV: 91.5 fL (ref 78.0–100.0)
MONO ABS: 0.6 10*3/uL (ref 0.1–1.0)
MONOS PCT: 11 % (ref 3–12)
Neutro Abs: 2.8 10*3/uL (ref 1.7–7.7)
Neutrophils Relative %: 54 % (ref 43–77)
Platelets: 182 10*3/uL (ref 150–400)
RBC: 4.84 MIL/uL (ref 4.22–5.81)
RDW: 13.6 % (ref 11.5–15.5)
WBC: 5.2 10*3/uL (ref 4.0–10.5)

## 2014-07-07 LAB — LIPID PANEL
CHOLESTEROL: 220 mg/dL — AB (ref 0–200)
HDL: 52 mg/dL (ref >39)
LDL Cholesterol: 140 mg/dL — ABNORMAL HIGH (ref 0–99)
TRIGLYCERIDES: 139 mg/dL (ref <150)
Total CHOL/HDL Ratio: 4.2 Ratio
VLDL: 28 mg/dL (ref 0–40)

## 2014-07-11 ENCOUNTER — Encounter: Payer: Self-pay | Admitting: Family Medicine

## 2014-07-11 ENCOUNTER — Ambulatory Visit (INDEPENDENT_AMBULATORY_CARE_PROVIDER_SITE_OTHER): Payer: 59 | Admitting: Family Medicine

## 2014-07-11 VITALS — BP 126/90 | HR 80 | Temp 98.4°F | Resp 14 | Ht 72.0 in | Wt 230.0 lb

## 2014-07-11 DIAGNOSIS — Z0000 Encounter for general adult medical examination without abnormal findings: Secondary | ICD-10-CM

## 2014-07-11 NOTE — Progress Notes (Signed)
Subjective:    Patient ID: Evan Strickland, male    DOB: [birth year], 48 y.o.   MRN: [id number]  HPI  Patient is a very pleasant 48 year old white male who is here today for complete physical exam. He is a 1 cm cystlike mass on the volar surface of his right wrist near the distal radius. He is feeling mobile and nontender. He is having some mild wrist pain on the ulnar aspect of his wrist. He denies any injury to the wrist. The cyst appears to be a ganglion cyst. His most recent lab work is listed below: Lab on 07/07/2014  Component Date Value Ref Range Status  . WBC 07/07/2014 5.2  4.0 - 10.5 K/uL Final  . RBC 07/07/2014 4.84  4.22 - 5.81 MIL/uL Final  . Hemoglobin 07/07/2014 15.5  13.0 - 17.0 g/dL Final  . HCT 07/07/2014 44.3  39.0 - 52.0 % Final  . MCV 07/07/2014 91.5  78.0 - 100.0 fL Final  . MCH 07/07/2014 32.0  26.0 - 34.0 pg Final  . MCHC 07/07/2014 35.0  30.0 - 36.0 g/dL Final  . RDW 07/07/2014 13.6  11.5 - 15.5 % Final  . Platelets 07/07/2014 182  150 - 400 K/uL Final  . Neutrophils Relative % 07/07/2014 54  43 - 77 % Final  . Neutro Abs 07/07/2014 2.8  1.7 - 7.7 K/uL Final  . Lymphocytes Relative 07/07/2014 31  12 - 46 % Final  . Lymphs Abs 07/07/2014 1.6  0.7 - 4.0 K/uL Final  . Monocytes Relative 07/07/2014 11  3 - 12 % Final  . Monocytes Absolute 07/07/2014 0.6  0.1 - 1.0 K/uL Final  . Eosinophils Relative 07/07/2014 4  0 - 5 % Final  . Eosinophils Absolute 07/07/2014 0.2  0.0 - 0.7 K/uL Final  . Basophils Relative 07/07/2014 0  0 - 1 % Final  . Basophils Absolute 07/07/2014 0.0  0.0 - 0.1 K/uL Final  . Smear Review 07/07/2014 Criteria for review not met   Final  . Sodium 07/07/2014 139  135 - 145 mEq/L Final  . Potassium 07/07/2014 4.6  3.5 - 5.3 mEq/L Final  . Chloride 07/07/2014 102  96 - 112 mEq/L Final  . CO2 07/07/2014 28  19 - 32 mEq/L Final  . Glucose, Bld 07/07/2014 100* 70 - 99 mg/dL Final  . BUN 07/07/2014 15  6 - 23 mg/dL Final  . Creat 07/07/2014 1.04   0.50 - 1.35 mg/dL Final  . Total Bilirubin 07/07/2014 0.8  0.2 - 1.2 mg/dL Final  . Alkaline Phosphatase 07/07/2014 44  39 - 117 U/L Final  . AST 07/07/2014 30  0 - 37 U/L Final  . ALT 07/07/2014 48  0 - 53 U/L Final  . Total Protein 07/07/2014 7.4  6.0 - 8.3 g/dL Final  . Albumin 07/07/2014 4.7  3.5 - 5.2 g/dL Final  . Calcium 07/07/2014 9.7  8.4 - 10.5 mg/dL Final  . GFR, Est African American 07/07/2014 >89   Final  . GFR, Est Non African American 07/07/2014 86   Final   Comment:                            The estimated GFR is a calculation valid for adults (>=20 years old)                          that uses the CKD-EPI algorithm to  adjust for age and sex. It is                            not to be used for children, pregnant women, hospitalized patients,                             patients on dialysis, or with rapidly changing kidney function.                          According to the NKDEP, eGFR >89 is normal, 60-89 shows mild                          impairment, 30-59 shows moderate impairment, 15-29 shows severe                          impairment and <15 is ESRD.                             Marland Kitchen Cholesterol 07/07/2014 220* 0 - 200 mg/dL Final   Comment: ATP III Classification:                                < 200        mg/dL        Desirable                               200 - 239     mg/dL        Borderline High                               >= 240        mg/dL        High                             . Triglycerides 07/07/2014 139  <150 mg/dL Final  . HDL 07/07/2014 52  >39 mg/dL Final  . Total CHOL/HDL Ratio 07/07/2014 4.2   Final  . VLDL 07/07/2014 28  0 - 40 mg/dL Final  . LDL Cholesterol 07/07/2014 140* 0 - 99 mg/dL Final   Comment:                            Total Cholesterol/HDL Ratio:CHD Risk                                                 Coronary Heart Disease Risk Table                                                                 Men  Women                                    1/2 Average Risk              3.4        3.3                                       Average Risk              5.0        4.4                                    2X Average Risk              9.6        7.1                                    3X Average Risk             23.4       11.0                          Use the calculated Patient Ratio above and the CHD Risk table                           to determine the patient's CHD Risk.                          ATP III Classification (LDL):                                < 100        mg/dL         Optimal                               100 - 129     mg/dL         Near or Above Optimal                               130 - 159     mg/dL         Borderline High                               160 - 189     mg/dL         High                                > 190        mg/dL         Very High  Past Medical History  Diagnosis Date  . Hypertension   . Hyperlipidemia    Past Surgical History  Procedure Laterality Date  . Shoulder surgery Bilateral   . Decompression fasciotomy leg Right   . Laparoscopic appendectomy N/A 04/01/2013    Procedure: APPENDECTOMY LAPAROSCOPIC;  Surgeon: Rolm Bookbinder, MD;  Location: [facility];  Service: General;  Laterality: N/A;  . Appendectomy  04/01/13   Current Outpatient Prescriptions on File Prior to Visit  Medication Sig Dispense Refill  . aspirin EC 81 MG tablet Take 81 mg by mouth daily.      . benazepril (LOTENSIN) 10 MG tablet TAKE 1 TABLET BY MOUTH EVERY DAY  30 tablet  0  . fish oil-omega-3 fatty acids 1000 MG capsule Take 1 g by mouth 2 (two) times daily.       No current facility-administered medications on file prior to visit.   No Known Allergies History   Social History  . Marital Status: Married    Spouse Name: N/A    Number of Children: N/A  . Years of Education: N/A   Occupational History  . Not on file.   Social History Main Topics  . Smoking  status: Never Smoker   . Smokeless tobacco: Not on file  . Alcohol Use: Yes     Comment: occ  . Drug Use: No  . Sexual Activity: Not on file   Other Topics Concern  . Not on file   Social History Narrative  . No narrative on file   Family History  Problem Relation Age of Onset  . Heart disease Mother   . Glaucoma Maternal Grandmother   . Diabetes Sister     Type II     Review of Systems  All other systems reviewed and are negative.      Objective:   Physical Exam  Vitals reviewed. Constitutional: He is oriented to person, place, and time. He appears well-developed and well-nourished. No distress.  HENT:  Head: Normocephalic and atraumatic.  Right Ear: External ear normal.  Left Ear: External ear normal.  Nose: Nose normal.  Mouth/Throat: Oropharynx is clear and moist. No oropharyngeal exudate.  Eyes: Conjunctivae and EOM are normal. Pupils are equal, round, and reactive to light. Right eye exhibits no discharge. Left eye exhibits no discharge. No scleral icterus.  Neck: Normal range of motion. Neck supple. No JVD present. No tracheal deviation present. No thyromegaly present.  Cardiovascular: Normal rate, regular rhythm, normal heart sounds and intact distal pulses.  Exam reveals no gallop and no friction rub.   No murmur heard. Pulmonary/Chest: Effort normal and breath sounds normal. No stridor. No respiratory distress. He has no wheezes. He has no rales. He exhibits no tenderness.  Abdominal: Soft. Bowel sounds are normal. He exhibits no distension and no mass. There is no tenderness. There is no rebound and no guarding.  Genitourinary: Penis normal.  Musculoskeletal: Normal range of motion. He exhibits no edema and no tenderness.  Lymphadenopathy:    He has no cervical adenopathy.  Neurological: He is alert and oriented to person, place, and time. He has normal reflexes. He displays normal reflexes. No cranial nerve deficit. He exhibits normal muscle tone.  Coordination normal.  Skin: Skin is warm. No rash noted. He is not diaphoretic. No erythema. No pallor.  Psychiatric: He has a normal mood and affect. His behavior is normal. Judgment and thought content normal.          Assessment & Plan:  Routine general medical  examination at a health care facility  Patient's physical exam is completely normal. Delusional his wrist begins to grow, I recommend orthopedic consultation for excision. Blood pressure is borderline. I asked the patient monitor his blood pressure closely. If it is consistently greater than 140/90 I would increase benazepril. Cholesterol is elevated at 140. Based on his age, his hypertension, and his family history of premature cardiovascular disease, I recommended that LDL cholesterol less than 130. Patient like dried therapeutic lifestyle changes and weight loss. Recheck cholesterol in 6 months. If still elevated, I would recommend a statin medication.

## 2014-07-28 ENCOUNTER — Other Ambulatory Visit: Payer: Self-pay | Admitting: Family Medicine

## 2015-07-18 ENCOUNTER — Encounter: Payer: Self-pay | Admitting: Family Medicine

## 2015-07-18 ENCOUNTER — Ambulatory Visit (INDEPENDENT_AMBULATORY_CARE_PROVIDER_SITE_OTHER): Payer: 59 | Admitting: Family Medicine

## 2015-07-18 VITALS — BP 122/98 | HR 78 | Temp 98.3°F | Resp 16 | Ht 72.0 in | Wt 235.0 lb

## 2015-07-18 DIAGNOSIS — J321 Chronic frontal sinusitis: Secondary | ICD-10-CM

## 2015-07-18 MED ORDER — CEFDINIR 300 MG PO CAPS: 300.0000 mg | ORAL_CAPSULE | Freq: Two times a day (BID) | ORAL | Status: DC

## 2015-07-18 MED ORDER — PREDNISONE 20 MG PO TABS: ORAL_TABLET | ORAL | Status: DC

## 2015-07-18 NOTE — Progress Notes (Signed)
   Subjective:    Patient ID: Evan Strickland, male    DOB: January 31, 1966, 49 y.o.   MRN: [id number]  HPI Patient symptoms of been present for 5-6 weeks. They include frontal sinus pain and pressure bilaterally, eustachian tube dysfunction, headache, fevers and chills, postnasal drip, and nonproductive cough. He hurts in his face as well as in his frontal sinus area. He is taken amoxicillin from an urgent care with no improvement.  Symptoms seem to be worsening now 5 weeks in. Past Medical History  Diagnosis Date  . Hypertension   . Hyperlipidemia    Past Surgical History  Procedure Laterality Date  . Shoulder surgery Bilateral   . Decompression fasciotomy leg Right   . Laparoscopic appendectomy N/A 04/01/2013    Procedure: APPENDECTOMY LAPAROSCOPIC;  Surgeon: Rolm Bookbinder, MD;  Location: [facility];  Service: General;  Laterality: N/A;  . Appendectomy  04/01/13   Current Outpatient Prescriptions on File Prior to Visit  Medication Sig Dispense Refill  . aspirin EC 81 MG tablet Take 81 mg by mouth daily.    . benazepril (LOTENSIN) 10 MG tablet TAKE 1 TABLET BY MOUTH EVERY DAY. 30 tablet 11  . fish oil-omega-3 fatty acids 1000 MG capsule Take 1 g by mouth 2 (two) times daily.     No current facility-administered medications on file prior to visit.   No Known Allergies Social History   Social History  . Marital Status: Married    Spouse Name: N/A  . Number of Children: N/A  . Years of Education: N/A   Occupational History  . Not on file.   Social History Main Topics  . Smoking status: Never Smoker   . Smokeless tobacco: Not on file  . Alcohol Use: Yes     Comment: occ  . Drug Use: No  . Sexual Activity: Not on file   Other Topics Concern  . Not on file   Social History Narrative      Review of Systems  All other systems reviewed and are negative.      Objective:   Physical Exam  Constitutional: He appears well-developed and well-nourished.  HENT:  Right Ear:  Tympanic membrane, external ear and ear canal normal.  Left Ear: Tympanic membrane, external ear and ear canal normal.  Nose: Mucosal edema and rhinorrhea present. Right sinus exhibits maxillary sinus tenderness and frontal sinus tenderness. Left sinus exhibits maxillary sinus tenderness and frontal sinus tenderness.  Mouth/Throat: No oropharyngeal exudate.  Cardiovascular: Normal rate, regular rhythm and normal heart sounds.   Pulmonary/Chest: Effort normal and breath sounds normal. No respiratory distress. He has no wheezes. He has no rales.  Vitals reviewed.         Assessment & Plan:  Chronic frontal sinusitis - Plan: cefdinir (OMNICEF) 300 MG capsule, predniSONE (DELTASONE) 20 MG tablet  Try prednisone taper pack along with Omnicef 300 mg for 10 days. If symptoms are not improving at that time, I recommend an ENT referral for chronic sinusitis. Also recommended nasal saline 4 times a day.

## 2015-07-29 ENCOUNTER — Other Ambulatory Visit: Payer: Self-pay | Admitting: Family Medicine

## 2015-07-31 ENCOUNTER — Telehealth: Payer: Self-pay | Admitting: Family Medicine

## 2015-07-31 DIAGNOSIS — J328 Other chronic sinusitis: Secondary | ICD-10-CM

## 2015-07-31 NOTE — Telephone Encounter (Signed)
Referral placed.

## 2015-07-31 NOTE — Telephone Encounter (Signed)
Ok with referral. 

## 2015-07-31 NOTE — Telephone Encounter (Signed)
Patient calling to say that his sinus infection is still not better and would like referral to [facility] as discussed with dr Dennard Schaumann  [phone]

## 2015-07-31 NOTE — Telephone Encounter (Signed)
Ok to do

## 2015-11-07 ENCOUNTER — Other Ambulatory Visit: Payer: Self-pay | Admitting: Family Medicine

## 2015-11-07 ENCOUNTER — Other Ambulatory Visit: Payer: 59

## 2015-11-07 DIAGNOSIS — Z79899 Other long term (current) drug therapy: Secondary | ICD-10-CM

## 2015-11-07 DIAGNOSIS — E785 Hyperlipidemia, unspecified: Secondary | ICD-10-CM | POA: Insufficient documentation

## 2015-11-07 DIAGNOSIS — I10 Essential (primary) hypertension: Secondary | ICD-10-CM

## 2015-11-07 DIAGNOSIS — Z0000 Encounter for general adult medical examination without abnormal findings: Secondary | ICD-10-CM

## 2015-11-07 LAB — LIPID PANEL
CHOLESTEROL: 187 mg/dL (ref 125–200)
HDL: 48 mg/dL (ref >=40)
LDL Cholesterol: 108 mg/dL (ref <130)
TRIGLYCERIDES: 157 mg/dL — AB (ref <150)
Total CHOL/HDL Ratio: 3.9 Ratio (ref <=5.0)
VLDL: 31 mg/dL — AB (ref <30)

## 2015-11-07 LAB — CBC WITH DIFFERENTIAL/PLATELET
Basophils Absolute: 0.1 10*3/uL (ref 0.0–0.1)
Basophils Relative: 1 % (ref 0–1)
EOS ABS: 0.3 10*3/uL (ref 0.0–0.7)
Eosinophils Relative: 5 % (ref 0–5)
HCT: 43.1 % (ref 39.0–52.0)
HEMOGLOBIN: 14.7 g/dL (ref 13.0–17.0)
LYMPHS ABS: 1.4 10*3/uL (ref 0.7–4.0)
Lymphocytes Relative: 23 % (ref 12–46)
MCH: 32 pg (ref 26.0–34.0)
MCHC: 34.1 g/dL (ref 30.0–36.0)
MCV: 93.7 fL (ref 78.0–100.0)
MONO ABS: 0.8 10*3/uL (ref 0.1–1.0)
MONOS PCT: 13 % — AB (ref 3–12)
MPV: 10.6 fL (ref 8.6–12.4)
NEUTROS PCT: 58 % (ref 43–77)
Neutro Abs: 3.5 10*3/uL (ref 1.7–7.7)
PLATELETS: 164 10*3/uL (ref 150–400)
RBC: 4.6 MIL/uL (ref 4.22–5.81)
RDW: 13 % (ref 11.5–15.5)
WBC: 6.1 10*3/uL (ref 4.0–10.5)

## 2015-11-07 LAB — TSH: TSH: 1.432 u[IU]/mL (ref 0.350–4.500)

## 2015-11-07 LAB — COMPLETE METABOLIC PANEL WITH GFR
ALT: 37 U/L (ref 9–46)
AST: 27 U/L (ref 10–40)
Albumin: 4.1 g/dL (ref 3.6–5.1)
Alkaline Phosphatase: 47 U/L (ref 40–115)
BILIRUBIN TOTAL: 0.5 mg/dL (ref 0.2–1.2)
BUN: 15 mg/dL (ref 7–25)
CHLORIDE: 102 mmol/L (ref 98–110)
CO2: 27 mmol/L (ref 20–31)
Calcium: 9.4 mg/dL (ref 8.6–10.3)
Creat: 0.95 mg/dL (ref 0.60–1.35)
Glucose, Bld: 93 mg/dL (ref 70–99)
POTASSIUM: 4.3 mmol/L (ref 3.5–5.3)
SODIUM: 139 mmol/L (ref 135–146)
TOTAL PROTEIN: 6.6 g/dL (ref 6.1–8.1)

## 2015-11-09 ENCOUNTER — Encounter: Payer: Self-pay | Admitting: Family Medicine

## 2015-11-09 ENCOUNTER — Ambulatory Visit (INDEPENDENT_AMBULATORY_CARE_PROVIDER_SITE_OTHER): Payer: Commercial Managed Care - HMO | Admitting: Family Medicine

## 2015-11-09 VITALS — BP 126/88 | HR 100 | Temp 99.4°F | Resp 16 | Ht 72.0 in | Wt 233.0 lb

## 2015-11-09 DIAGNOSIS — Z0000 Encounter for general adult medical examination without abnormal findings: Secondary | ICD-10-CM | POA: Diagnosis not present

## 2015-11-09 NOTE — Progress Notes (Signed)
Subjective:    Patient ID: Evan Strickland, male    DOB: 07/10/1966, 49 y.o.   MRN: [id number]  HPI   Patient is a very pleasant 49 year old white male who is here today for complete physical exam. He has really changed his diet since the last exam. This is evidenced by the tremendous drop in his total cholesterol as well as his LDL cholesterol. Furthermore his fasting blood sugar has also dropped substantially. I am very proud of the patient for doing this. He declines a flu shot today. Tetanus shot is up-to-date. He is asking for a referral to an orthopedic surgeon to remove the ganglion cyst on the volar aspect of his right wrist.  His most recent lab work is listed below: Appointment on 11/07/2015  Component Date Value Ref Range Status  . Sodium 11/07/2015 139  135 - 146 mmol/L Final  . Potassium 11/07/2015 4.3  3.5 - 5.3 mmol/L Final  . Chloride 11/07/2015 102  98 - 110 mmol/L Final  . CO2 11/07/2015 27  20 - 31 mmol/L Final  . Glucose, Bld 11/07/2015 93  70 - 99 mg/dL Final  . BUN 11/07/2015 15  7 - 25 mg/dL Final  . Creat 11/07/2015 0.95  0.60 - 1.35 mg/dL Final  . Total Bilirubin 11/07/2015 0.5  0.2 - 1.2 mg/dL Final  . Alkaline Phosphatase 11/07/2015 47  40 - 115 U/L Final  . AST 11/07/2015 27  10 - 40 U/L Final  . ALT 11/07/2015 37  9 - 46 U/L Final  . Total Protein 11/07/2015 6.6  6.1 - 8.1 g/dL Final  . Albumin 11/07/2015 4.1  3.6 - 5.1 g/dL Final  . Calcium 11/07/2015 9.4  8.6 - 10.3 mg/dL Final  . GFR, Est African American 11/07/2015 >89  >=60 mL/min Final  . GFR, Est Non African American 11/07/2015 >89  >=60 mL/min Final   Comment:   The estimated GFR is a calculation valid for adults (>=15 years old) that uses the CKD-EPI algorithm to adjust for age and sex. It is   not to be used for children, pregnant women, hospitalized patients,    patients on dialysis, or with rapidly changing kidney function. According to the NKDEP, eGFR >89 is normal, 60-89 shows  mild impairment, 30-59 shows moderate impairment, 15-29 shows severe impairment and <15 is ESRD.     Marland Kitchen TSH 11/07/2015 1.432  0.350 - 4.500 uIU/mL Final  . Cholesterol 11/07/2015 187  125 - 200 mg/dL Final  . Triglycerides 11/07/2015 157* <150 mg/dL Final  . HDL 11/07/2015 48  >=40 mg/dL Final  . Total CHOL/HDL Ratio 11/07/2015 3.9  <=5.0 Ratio Final  . VLDL 11/07/2015 31* <30 mg/dL Final  . LDL Cholesterol 11/07/2015 108  <130 mg/dL Final   Comment:   Total Cholesterol/HDL Ratio:CHD Risk                        Coronary Heart Disease Risk Table                                        Men       Women          1/2 Average Risk              3.4        3.3  Average Risk              5.0        4.4           2X Average Risk              9.6        7.1           3X Average Risk             23.4       11.0 Use the calculated Patient Ratio above and the CHD Risk table  to determine the patient's CHD Risk.   . WBC 11/07/2015 6.1  4.0 - 10.5 K/uL Final  . RBC 11/07/2015 4.60  4.22 - 5.81 MIL/uL Final  . Hemoglobin 11/07/2015 14.7  13.0 - 17.0 g/dL Final  . HCT 11/07/2015 43.1  39.0 - 52.0 % Final  . MCV 11/07/2015 93.7  78.0 - 100.0 fL Final  . MCH 11/07/2015 32.0  26.0 - 34.0 pg Final  . MCHC 11/07/2015 34.1  30.0 - 36.0 g/dL Final  . RDW 11/07/2015 13.0  11.5 - 15.5 % Final  . Platelets 11/07/2015 164  150 - 400 K/uL Final  . MPV 11/07/2015 10.6  8.6 - 12.4 fL Final  . Neutrophils Relative % 11/07/2015 58  43 - 77 % Final  . Neutro Abs 11/07/2015 3.5  1.7 - 7.7 K/uL Final  . Lymphocytes Relative 11/07/2015 23  12 - 46 % Final  . Lymphs Abs 11/07/2015 1.4  0.7 - 4.0 K/uL Final  . Monocytes Relative 11/07/2015 13* 3 - 12 % Final  . Monocytes Absolute 11/07/2015 0.8  0.1 - 1.0 K/uL Final  . Eosinophils Relative 11/07/2015 5  0 - 5 % Final  . Eosinophils Absolute 11/07/2015 0.3  0.0 - 0.7 K/uL Final  . Basophils Relative 11/07/2015 1  0 - 1 % Final  . Basophils Absolute  11/07/2015 0.1  0.0 - 0.1 K/uL Final  . Smear Review 11/07/2015 Criteria for review not met   Final   Past Medical History  Diagnosis Date  . Hypertension   . Hyperlipidemia    Past Surgical History  Procedure Laterality Date  . Shoulder surgery Bilateral   . Decompression fasciotomy leg Right   . Laparoscopic appendectomy N/A 04/01/2013    Procedure: APPENDECTOMY LAPAROSCOPIC;  Surgeon: Rolm Bookbinder, MD;  Location: [facility];  Service: General;  Laterality: N/A;  . Appendectomy  04/01/13   Current Outpatient Prescriptions on File Prior to Visit  Medication Sig Dispense Refill  . aspirin EC 81 MG tablet Take 81 mg by mouth daily.    . benazepril (LOTENSIN) 10 MG tablet TAKE 1 TABLET BY MOUTH EVERY DAY 30 tablet 11  . cefdinir (OMNICEF) 300 MG capsule Take 1 capsule (300 mg total) by mouth 2 (two) times daily. 20 capsule 0  . fish oil-omega-3 fatty acids 1000 MG capsule Take 1 g by mouth 2 (two) times daily.    . predniSONE (DELTASONE) 20 MG tablet 3 tabs poqday 1-2, 2 tabs poqday 3-4, 1 tab poqday 5-6 12 tablet 0  . promethazine-dextromethorphan (PROMETHAZINE-DM) 6.25-15 MG/5ML syrup TK 5 ML PO EVERY 6 HOURS AS NEEDED FOR COUGH  0   No current facility-administered medications on file prior to visit.   No Known Allergies Social History   Social History  . Marital Status: Married    Spouse Name: N/A  . Number of Children: N/A  . Years of Education:  N/A   Occupational History  . Not on file.   Social History Main Topics  . Smoking status: Never Smoker   . Smokeless tobacco: Not on file  . Alcohol Use: Yes     Comment: occ  . Drug Use: No  . Sexual Activity: Not on file   Other Topics Concern  . Not on file   Social History Narrative   Family History  Problem Relation Age of Onset  . Heart disease Mother   . Glaucoma Maternal Grandmother   . Diabetes Sister     Type II     Review of Systems  All other systems reviewed and are negative.      Objective:    Physical Exam  Constitutional: He is oriented to person, place, and time. He appears well-developed and well-nourished. No distress.  HENT:  Head: Normocephalic and atraumatic.  Right Ear: External ear normal.  Left Ear: External ear normal.  Nose: Nose normal.  Mouth/Throat: Oropharynx is clear and moist. No oropharyngeal exudate.  Eyes: Conjunctivae and EOM are normal. Pupils are equal, round, and reactive to light. Right eye exhibits no discharge. Left eye exhibits no discharge. No scleral icterus.  Neck: Normal range of motion. Neck supple. No JVD present. No tracheal deviation present. No thyromegaly present.  Cardiovascular: Normal rate, regular rhythm, normal heart sounds and intact distal pulses.  Exam reveals no gallop and no friction rub.   No murmur heard. Pulmonary/Chest: Effort normal and breath sounds normal. No stridor. No respiratory distress. He has no wheezes. He has no rales. He exhibits no tenderness.  Abdominal: Soft. Bowel sounds are normal. He exhibits no distension and no mass. There is no tenderness. There is no rebound and no guarding.  Genitourinary: Penis normal.  Musculoskeletal: Normal range of motion. He exhibits no edema or tenderness.  Lymphadenopathy:    He has no cervical adenopathy.  Neurological: He is alert and oriented to person, place, and time. He has normal reflexes. No cranial nerve deficit. He exhibits normal muscle tone. Coordination normal.  Skin: Skin is warm. No rash noted. He is not diaphoretic. No erythema. No pallor.  Psychiatric: He has a normal mood and affect. His behavior is normal. Judgment and thought content normal.  Vitals reviewed.         Assessment & Plan:  Routine general medical examination at a health care facility   Patient's physical exam is completely normal. Lab work is excellent. Blood pressure is excellent. I will make no changes at the present time to any of his medication. Recommended a flu shot but the patient  politely declined. I will gladly consult orthopedics surgery to remove the ganglion cyst on the volar surface of his right wrist however the patient asked that I wait until the summer because of his work. He will call me when he wants me to schedule this.

## 2016-04-03 ENCOUNTER — Other Ambulatory Visit: Payer: Self-pay | Admitting: Family Medicine

## 2016-07-29 ENCOUNTER — Other Ambulatory Visit: Payer: Self-pay | Admitting: Family Medicine

## 2016-11-28 ENCOUNTER — Other Ambulatory Visit: Payer: Self-pay | Admitting: Family Medicine

## 2016-11-29 ENCOUNTER — Other Ambulatory Visit: Payer: Self-pay | Admitting: Family Medicine

## 2016-11-29 NOTE — Telephone Encounter (Signed)
Medication refilled per protocol. 

## 2016-12-26 ENCOUNTER — Ambulatory Visit (INDEPENDENT_AMBULATORY_CARE_PROVIDER_SITE_OTHER): Payer: Commercial Managed Care - HMO | Admitting: Family Medicine

## 2016-12-26 ENCOUNTER — Encounter: Payer: Self-pay | Admitting: Family Medicine

## 2016-12-26 VITALS — BP 112/80 | HR 68 | Temp 99.1°F | Resp 16 | Ht 72.0 in | Wt 234.0 lb

## 2016-12-26 DIAGNOSIS — R509 Fever, unspecified: Secondary | ICD-10-CM | POA: Diagnosis not present

## 2016-12-26 LAB — INFLUENZA A AND B AG, IMMUNOASSAY
Influenza A Antigen: NOT DETECTED
Influenza B Antigen: NOT DETECTED

## 2016-12-26 MED ORDER — OSELTAMIVIR PHOSPHATE 75 MG PO CAPS: 75.0000 mg | ORAL_CAPSULE | Freq: Two times a day (BID) | ORAL | 0 refills | Status: DC

## 2016-12-26 NOTE — Progress Notes (Signed)
   Subjective:    Patient ID: Evan Strickland, male    DOB: Apr 11, 1966, 51 y.o.   MRN: [id number]  HPI To the patient's coworkers have had the flu. He has been exposed to them. Starting suddenly yesterday, he developed a fever to 101, diffuse body aches, diffuse joint pains, cough and runny nose and scratchy throat. Symptoms are consistent with influenza Past Medical History:  Diagnosis Date  . Hyperlipidemia   . Hypertension    Past Surgical History:  Procedure Laterality Date  . APPENDECTOMY  04/01/13  . DECOMPRESSION FASCIOTOMY LEG Right   . LAPAROSCOPIC APPENDECTOMY N/A 04/01/2013   Procedure: APPENDECTOMY LAPAROSCOPIC;  Surgeon: Rolm Bookbinder, MD;  Location: [facility];  Service: General;  Laterality: N/A;  . SHOULDER SURGERY Bilateral    Current Outpatient Prescriptions on File Prior to Visit  Medication Sig Dispense Refill  . aspirin EC 81 MG tablet Take 81 mg by mouth daily.    . benazepril (LOTENSIN) 10 MG tablet TAKE 1 TABLET BY MOUTH DAILY 90 tablet 3  . fish oil-omega-3 fatty acids 1000 MG capsule Take 1 g by mouth 2 (two) times daily.     No current facility-administered medications on file prior to visit.    No Known Allergies Social History   Social History  . Marital status: Married    Spouse name: N/A  . Number of children: N/A  . Years of education: N/A   Occupational History  . Not on file.   Social History Main Topics  . Smoking status: Never Smoker  . Smokeless tobacco: Never Used  . Alcohol use Yes     Comment: occ  . Drug use: No  . Sexual activity: Not on file   Other Topics Concern  . Not on file   Social History Narrative  . No narrative on file      Review of Systems  All other systems reviewed and are negative.      Objective:   Physical Exam  Constitutional: He appears well-developed and well-nourished.  HENT:  Right Ear: External ear normal.  Left Ear: External ear normal.  Nose: Nose normal.  Mouth/Throat: Oropharynx is  clear and moist. No oropharyngeal exudate.  Eyes: Conjunctivae and EOM are normal. Right eye exhibits no discharge. Left eye exhibits no discharge. No scleral icterus.  Neck: Neck supple.  Cardiovascular: Normal rate, regular rhythm and normal heart sounds.   Pulmonary/Chest: Effort normal and breath sounds normal. No respiratory distress. He has no wheezes. He has no rales.  Abdominal: Soft. Bowel sounds are normal. He exhibits no distension. There is no tenderness. There is no rebound and no guarding.  Lymphadenopathy:    He has no cervical adenopathy.  Vitals reviewed.         Assessment & Plan:  Fever chills - Plan: Influenza A and B Ag, Immunoassay, oseltamivir (TAMIFLU) 75 MG capsule  Patient's history and symptoms sound like influenza. Patient has a relatively mild case. Therefore I recommended that he probably shouldn't get the Tamiflu as I do not believe it would benefit him dramatically. I did give him a prescription for Tamiflu 75 mg twice a day for 5 days in case he decides to get it. However I would recommend staying out of work until he is been afebrile for 48 hours, pushing fluids, taking Tylenol and ibuprofen as needed and just generally resting

## 2017-03-06 DIAGNOSIS — M67431 Ganglion, right wrist: Secondary | ICD-10-CM | POA: Diagnosis not present

## 2017-08-13 ENCOUNTER — Other Ambulatory Visit: Payer: 59

## 2017-08-13 DIAGNOSIS — I10 Essential (primary) hypertension: Secondary | ICD-10-CM

## 2017-08-13 DIAGNOSIS — E785 Hyperlipidemia, unspecified: Secondary | ICD-10-CM

## 2017-08-13 DIAGNOSIS — Z79899 Other long term (current) drug therapy: Secondary | ICD-10-CM

## 2017-08-13 DIAGNOSIS — Z0000 Encounter for general adult medical examination without abnormal findings: Secondary | ICD-10-CM

## 2017-08-14 ENCOUNTER — Other Ambulatory Visit: Payer: Commercial Managed Care - HMO

## 2017-08-14 LAB — CBC WITH DIFFERENTIAL/PLATELET
BASOS PCT: 0.7 %
Basophils Absolute: 39 cells/uL (ref 0–200)
EOS PCT: 3.3 %
Eosinophils Absolute: 182 cells/uL (ref 15–500)
HCT: 43.4 % (ref 38.5–50.0)
Hemoglobin: 14.9 g/dL (ref 13.2–17.1)
Lymphs Abs: 1766 cells/uL (ref 850–3900)
MCH: 32.3 pg (ref 27.0–33.0)
MCHC: 34.3 g/dL (ref 32.0–36.0)
MCV: 94.1 fL (ref 80.0–100.0)
MONOS PCT: 9.9 %
MPV: 10.8 fL (ref 7.5–12.5)
NEUTROS ABS: 2970 {cells}/uL (ref 1500–7800)
Neutrophils Relative %: 54 %
PLATELETS: 190 10*3/uL (ref 140–400)
RBC: 4.61 10*6/uL (ref 4.20–5.80)
RDW: 12.1 % (ref 11.0–15.0)
TOTAL LYMPHOCYTE: 32.1 %
WBC mixed population: 545 cells/uL (ref 200–950)
WBC: 5.5 10*3/uL (ref 3.8–10.8)

## 2017-08-14 LAB — COMPLETE METABOLIC PANEL WITH GFR
AG Ratio: 2 (calc) (ref 1.0–2.5)
ALKALINE PHOSPHATASE (APISO): 42 U/L (ref 40–115)
ALT: 42 U/L (ref 9–46)
AST: 31 U/L (ref 10–40)
Albumin: 4.7 g/dL (ref 3.6–5.1)
BILIRUBIN TOTAL: 0.7 mg/dL (ref 0.2–1.2)
BUN: 18 mg/dL (ref 7–25)
CO2: 28 mmol/L (ref 20–32)
CREATININE: 1.12 mg/dL (ref 0.60–1.35)
Calcium: 10 mg/dL (ref 8.6–10.3)
Chloride: 103 mmol/L (ref 98–110)
GFR, Est African American: 89 mL/min/{1.73_m2} (ref >=60)
GFR, Est Non African American: 77 mL/min/{1.73_m2} (ref >=60)
GLUCOSE: 104 mg/dL — AB (ref 65–99)
Globulin: 2.3 g/dL (calc) (ref 1.9–3.7)
Potassium: 4.9 mmol/L (ref 3.5–5.3)
SODIUM: 139 mmol/L (ref 135–146)
Total Protein: 7 g/dL (ref 6.1–8.1)

## 2017-08-14 LAB — LIPID PANEL
CHOL/HDL RATIO: 5.2 (calc) — AB (ref <5.0)
Cholesterol: 228 mg/dL — ABNORMAL HIGH (ref <200)
HDL: 44 mg/dL (ref >40)
LDL CHOLESTEROL (CALC): 157 mg/dL — AB
NON-HDL CHOLESTEROL (CALC): 184 mg/dL — AB (ref <130)
Triglycerides: 148 mg/dL (ref <150)

## 2017-08-18 ENCOUNTER — Ambulatory Visit (INDEPENDENT_AMBULATORY_CARE_PROVIDER_SITE_OTHER): Payer: 59 | Admitting: Family Medicine

## 2017-08-18 ENCOUNTER — Encounter: Payer: Self-pay | Admitting: Gastroenterology

## 2017-08-18 ENCOUNTER — Encounter: Payer: Self-pay | Admitting: Family Medicine

## 2017-08-18 VITALS — BP 118/78 | HR 84 | Temp 98.1°F | Resp 16 | Ht 72.0 in | Wt 235.0 lb

## 2017-08-18 DIAGNOSIS — Z0000 Encounter for general adult medical examination without abnormal findings: Secondary | ICD-10-CM | POA: Diagnosis not present

## 2017-08-18 DIAGNOSIS — Z23 Encounter for immunization: Secondary | ICD-10-CM

## 2017-08-18 DIAGNOSIS — E781 Pure hyperglyceridemia: Secondary | ICD-10-CM

## 2017-08-18 DIAGNOSIS — I10 Essential (primary) hypertension: Secondary | ICD-10-CM

## 2017-08-18 NOTE — Progress Notes (Signed)
Subjective:    Patient ID: Evan Strickland, male    DOB: 03/02/1966, 51 y.o.   MRN: [id number]  HPI  Patient is a very pleasant 51 year old white male who is here today for complete physical exam.  He admits he has slipped on his diet and a recent injury has kept him out of the gym for the last 6 months.  As a result, his most recent cholesterol has dramatically worsened compared to last year.  His most recent lab work is listed below: Lab on 08/13/2017  Component Date Value Ref Range Status  . Glucose, Bld 08/13/2017 104* 65 - 99 mg/dL Final   Comment: .            Fasting reference interval . For someone without known diabetes, a glucose value between 100 and 125 mg/dL is consistent with prediabetes and should be confirmed with a follow-up test. .   . BUN 08/13/2017 18  7 - 25 mg/dL Final  . Creat 08/13/2017 1.12  0.60 - 1.35 mg/dL Final  . GFR, Est Non African American 08/13/2017 77  > OR = 60 mL/min/1.71m2 Final  . GFR, Est African American 08/13/2017 89  > OR = 60 mL/min/1.88m2 Final  . BUN/Creatinine Ratio 43/15/4008 NOT APPLICABLE  6 - 22 (calc) Final  . Sodium 08/13/2017 139  135 - 146 mmol/L Final  . Potassium 08/13/2017 4.9  3.5 - 5.3 mmol/L Final  . Chloride 08/13/2017 103  98 - 110 mmol/L Final  . CO2 08/13/2017 28  20 - 32 mmol/L Final  . Calcium 08/13/2017 10.0  8.6 - 10.3 mg/dL Final  . Total Protein 08/13/2017 7.0  6.1 - 8.1 g/dL Final  . Albumin 08/13/2017 4.7  3.6 - 5.1 g/dL Final  . Globulin 08/13/2017 2.3  1.9 - 3.7 g/dL (calc) Final  . AG Ratio 08/13/2017 2.0  1.0 - 2.5 (calc) Final  . Total Bilirubin 08/13/2017 0.7  0.2 - 1.2 mg/dL Final  . Alkaline phosphatase (APISO) 08/13/2017 42  40 - 115 U/L Final  . AST 08/13/2017 31  10 - 40 U/L Final  . ALT 08/13/2017 42  9 - 46 U/L Final  . Cholesterol 08/13/2017 228* <200 mg/dL Final  . HDL 08/13/2017 44  >40 mg/dL Final  . Triglycerides 08/13/2017 148  <150 mg/dL Final  . LDL Cholesterol (Calc) 08/13/2017 157*  mg/dL (calc) Final   Comment: Reference range: <100 . Desirable range <100 mg/dL for primary prevention;   <70 mg/dL for patients with CHD or diabetic patients  with > or = 2 CHD risk factors. Marland Kitchen LDL-C is now calculated using the Martin-Hopkins  calculation, which is a validated novel method providing  better accuracy than the Friedewald equation in the  estimation of LDL-C.  Cresenciano Genre et al. Annamaria Helling. 6761;950(93): 2061-2068  (http://education.[employer].com/faq/FAQ164)   . Total CHOL/HDL Ratio 08/13/2017 5.2* <5.0 (calc) Final  . Non-HDL Cholesterol (Calc) 08/13/2017 184* <130 mg/dL (calc) Final   Comment: For patients with diabetes plus 1 major ASCVD risk  factor, treating to a non-HDL-C goal of <100 mg/dL  (LDL-C of <70 mg/dL) is considered a therapeutic  option.   . WBC 08/13/2017 5.5  3.8 - 10.8 Thousand/uL Final  . RBC 08/13/2017 4.61  4.20 - 5.80 Million/uL Final  . Hemoglobin 08/13/2017 14.9  13.2 - 17.1 g/dL Final  . HCT 08/13/2017 43.4  38.5 - 50.0 % Final  . MCV 08/13/2017 94.1  80.0 - 100.0 fL Final  . MCH 08/13/2017 32.3  27.0 - 33.0 pg Final  . MCHC 08/13/2017 34.3  32.0 - 36.0 g/dL Final  . RDW 08/13/2017 12.1  11.0 - 15.0 % Final  . Platelets 08/13/2017 190  140 - 400 Thousand/uL Final  . MPV 08/13/2017 10.8  7.5 - 12.5 fL Final  . Neutro Abs 08/13/2017 2970  1,500 - 7,800 cells/uL Final  . Lymphs Abs 08/13/2017 1766  850 - 3,900 cells/uL Final  . WBC mixed population 08/13/2017 545  200 - 950 cells/uL Final  . Eosinophils Absolute 08/13/2017 182  15 - 500 cells/uL Final  . Basophils Absolute 08/13/2017 39  0 - 200 cells/uL Final  . Neutrophils Relative % 08/13/2017 54  % Final  . Total Lymphocyte 08/13/2017 32.1  % Final  . Monocytes Relative 08/13/2017 9.9  % Final  . Eosinophils Relative 08/13/2017 3.3  % Final  . Basophils Relative 08/13/2017 0.7  % Final   Past Medical History:  Diagnosis Date  . Hyperlipidemia   . Hypertension    Past Surgical  History:  Procedure Laterality Date  . APPENDECTOMY  04/01/13  . DECOMPRESSION FASCIOTOMY LEG Right   . LAPAROSCOPIC APPENDECTOMY N/A 04/01/2013   Procedure: APPENDECTOMY LAPAROSCOPIC;  Surgeon: Rolm Bookbinder, MD;  Location: [facility];  Service: General;  Laterality: N/A;  . SHOULDER SURGERY Bilateral    Current Outpatient Prescriptions on File Prior to Visit  Medication Sig Dispense Refill  . aspirin EC 81 MG tablet Take 81 mg by mouth daily.    . benazepril (LOTENSIN) 10 MG tablet TAKE 1 TABLET BY MOUTH DAILY 90 tablet 3  . fish oil-omega-3 fatty acids 1000 MG capsule Take 1 g by mouth 2 (two) times daily.    Marland Kitchen oseltamivir (TAMIFLU) 75 MG capsule Take 1 capsule (75 mg total) by mouth 2 (two) times daily. 10 capsule 0   No current facility-administered medications on file prior to visit.    No Known Allergies Social History   Social History  . Marital status: Married    Spouse name: N/A  . Number of children: N/A  . Years of education: N/A   Occupational History  . Not on file.   Social History Main Topics  . Smoking status: Never Smoker  . Smokeless tobacco: Never Used  . Alcohol use Yes     Comment: occ  . Drug use: No  . Sexual activity: Not on file   Other Topics Concern  . Not on file   Social History Narrative  . No narrative on file   Family History  Problem Relation Age of Onset  . Heart disease Mother   . Glaucoma Maternal Grandmother   . Diabetes Sister        Type II     Review of Systems  All other systems reviewed and are negative.      Objective:   Physical Exam  Constitutional: He is oriented to person, place, and time. He appears well-developed and well-nourished. No distress.  HENT:  Head: Normocephalic and atraumatic.  Right Ear: External ear normal.  Left Ear: External ear normal.  Nose: Nose normal.  Mouth/Throat: Oropharynx is clear and moist. No oropharyngeal exudate.  Eyes: Pupils are equal, round, and reactive to light.  Conjunctivae and EOM are normal. Right eye exhibits no discharge. Left eye exhibits no discharge. No scleral icterus.  Neck: Normal range of motion. Neck supple. No JVD present. No tracheal deviation present. No thyromegaly present.  Cardiovascular: Normal rate, regular rhythm, normal heart sounds and intact distal  pulses.  Exam reveals no gallop and no friction rub.   No murmur heard. Pulmonary/Chest: Effort normal and breath sounds normal. No stridor. No respiratory distress. He has no wheezes. He has no rales. He exhibits no tenderness.  Abdominal: Soft. Bowel sounds are normal. He exhibits no distension and no mass. There is no tenderness. There is no rebound and no guarding.  Genitourinary: Penis normal.  Musculoskeletal: Normal range of motion. He exhibits no edema or tenderness.  Lymphadenopathy:    He has no cervical adenopathy.  Neurological: He is alert and oriented to person, place, and time. He has normal reflexes. No cranial nerve deficit. He exhibits normal muscle tone. Coordination normal.  Skin: Skin is warm. No rash noted. He is not diaphoretic. No erythema. No pallor.  Psychiatric: He has a normal mood and affect. His behavior is normal. Judgment and thought content normal.  Vitals reviewed.         Assessment & Plan:  Routine general medical examination at a health care facility  Essential hypertension  Pure hyperglyceridemia   Patient's physical exam is completely normal. Blood pressure is excellent. Cholesterol is no longer a goal. We discussed starting a statin versus therapeutic lifestyle changes. Patient elects to try therapeutic lifestyle changes and recheck lab work in 6 months. I will schedule him for colonoscopy. He will receive his flu shot. He defers prostate cancer screening to his next visit

## 2017-08-18 NOTE — Addendum Note (Signed)
Addended by: Vonna Kotyk A on: 08/18/2017 04:57 PM   Modules accepted: Orders

## 2017-09-29 ENCOUNTER — Other Ambulatory Visit: Payer: Self-pay

## 2017-09-29 ENCOUNTER — Ambulatory Visit (AMBULATORY_SURGERY_CENTER): Payer: Self-pay | Admitting: *Deleted

## 2017-09-29 VITALS — Ht 72.0 in | Wt 230.0 lb

## 2017-09-29 DIAGNOSIS — Z1211 Encounter for screening for malignant neoplasm of colon: Secondary | ICD-10-CM

## 2017-09-29 MED ORDER — NA SULFATE-K SULFATE-MG SULF 17.5-3.13-1.6 GM/177ML PO SOLN: ORAL | 0 refills | Status: DC

## 2017-09-29 NOTE — Progress Notes (Signed)
Patient denies any allergies to eggs or soy. Patient denies any problems with anesthesia/sedation. Patient denies any oxygen use at home. Patient denies taking any diet/weight loss medications or blood thinners. EMMI education assisgned to patient on colonoscopy, this was explained and instructions given to patient. 

## 2017-10-08 ENCOUNTER — Encounter: Payer: Self-pay | Admitting: Gastroenterology

## 2017-10-13 ENCOUNTER — Ambulatory Visit (AMBULATORY_SURGERY_CENTER): Payer: 59 | Admitting: Gastroenterology

## 2017-10-13 ENCOUNTER — Other Ambulatory Visit: Payer: Self-pay

## 2017-10-13 ENCOUNTER — Encounter: Payer: Self-pay | Admitting: Gastroenterology

## 2017-10-13 VITALS — BP 104/55 | HR 65 | Temp 98.7°F | Resp 17 | Ht 72.0 in | Wt 230.0 lb

## 2017-10-13 DIAGNOSIS — K621 Rectal polyp: Secondary | ICD-10-CM | POA: Diagnosis not present

## 2017-10-13 DIAGNOSIS — D122 Benign neoplasm of ascending colon: Secondary | ICD-10-CM

## 2017-10-13 DIAGNOSIS — D128 Benign neoplasm of rectum: Secondary | ICD-10-CM

## 2017-10-13 DIAGNOSIS — Z1211 Encounter for screening for malignant neoplasm of colon: Secondary | ICD-10-CM

## 2017-10-13 DIAGNOSIS — D124 Benign neoplasm of descending colon: Secondary | ICD-10-CM

## 2017-10-13 DIAGNOSIS — D129 Benign neoplasm of anus and anal canal: Secondary | ICD-10-CM

## 2017-10-13 MED ORDER — SODIUM CHLORIDE 0.9 % IV SOLN: 500.0000 mL | INTRAVENOUS | Status: DC

## 2017-10-13 NOTE — Op Note (Signed)
[facility] Patient Name: Evan Strickland Procedure Date: 10/13/2017 9:33 AM MRN: [id number] Endoscopist: Remo Lipps P. Nikesh Teschner MD, MD Age: 51 Referring MD:  Date of Birth: 08-11-1966 Gender: Male Account #: [id number] Procedure:                Colonoscopy Indications:              Screening for colorectal malignant neoplasm, This                            is the patient's first colonoscopy Medicines:                Monitored Anesthesia Care Procedure:                Pre-Anesthesia Assessment:                           - Prior to the procedure, a History and Physical                            was performed, and patient medications and                            allergies were reviewed. The patient's tolerance of                            previous anesthesia was also reviewed. The risks                            and benefits of the procedure and the sedation                            options and risks were discussed with the patient.                            All questions were answered, and informed consent                            was obtained. Prior Anticoagulants: The patient has                            taken no previous anticoagulant or antiplatelet                            agents. ASA Grade Assessment: II - A patient with                            mild systemic disease. After reviewing the risks                            and benefits, the patient was deemed in                            satisfactory condition to undergo the procedure.  After obtaining informed consent, the colonoscope                            was passed under direct vision. Throughout the                            procedure, the patient's blood pressure, pulse, and                            oxygen saturations were monitored continuously. The                            Colonoscope was introduced through the anus and                            advanced to the the  cecum, identified by                            appendiceal orifice and ileocecal valve. The                            colonoscopy was performed without difficulty. The                            patient tolerated the procedure well. The quality                            of the bowel preparation was good. The ileocecal                            valve, appendiceal orifice, and rectum were                            photographed. Scope In: 9:41:29 AM Scope Out: 10:01:29 AM Scope Withdrawal Time: 0 hours 15 minutes 38 seconds  Total Procedure Duration: 0 hours 20 minutes 0 seconds  Findings:                 The perianal and digital rectal examinations were                            normal.                           A 8 mm polyp was found in the ascending colon. The                            polyp was sessile. The polyp was removed with a                            cold snare. Resection and retrieval were complete.                           A 3 mm polyp was found in the descending colon. The  polyp was sessile. The polyp was removed with a                            cold snare. Resection and retrieval were complete.                           A 4 mm polyp was found in the rectum. The polyp was                            sessile. The polyp was removed with a cold snare.                            Resection and retrieval were complete.                           Scattered medium-mouthed diverticula were found in                            the distal transverse colon and left colon.                           Internal hemorrhoids were found during retroflexion.                           The exam was otherwise without abnormality. Complications:            No immediate complications. Estimated blood loss:                            Minimal. Estimated Blood Loss:     Estimated blood loss was minimal. Impression:               - One 8 mm polyp in the ascending colon,  removed                            with a cold snare. Resected and retrieved.                           - One 3 mm polyp in the descending colon, removed                            with a cold snare. Resected and retrieved.                           - One 4 mm polyp in the rectum, removed with a cold                            snare. Resected and retrieved.                           - Diverticulosis in the distal transverse colon and                            in the left  colon.                           - Internal hemorrhoids.                           - The examination was otherwise normal. Recommendation:           - Patient has a contact number available for                            emergencies. The signs and symptoms of potential                            delayed complications were discussed with the                            patient. Return to normal activities tomorrow.                            Written discharge instructions were provided to the                            patient.                           - Resume previous diet.                           - Continue present medications.                           - Await pathology results.                           - Repeat colonoscopy is recommended for                            surveillance. The colonoscopy date will be                            determined after pathology results from today's                            exam become available for review.                           - No ibuprofen, naproxen, or other non-steroidal                            anti-inflammatory drugs for 2 weeks after polyp                            removal. Remo Lipps P. Hurbert Duran MD, MD 10/13/2017 10:05:37 AM This report has been signed electronically.

## 2017-10-13 NOTE — Progress Notes (Signed)
Pt's states no medical or surgical changes since previsit or office visit. 

## 2017-10-13 NOTE — Patient Instructions (Signed)
Impression/Recommendations:  Polyp handout given to patient. Diverticulosis handout given to patient. Hemorrhoid handout given to patient.  Resume previous diet. Continue present medications.  Repeat colonoscopy recommended for surveillance.  Date to be determined after pathology results reviewed.  No ibuprofen, naproxen, or other NSAID drugs for 2 weeks.  Tylenol only until 10/27/2017.  YOU HAD AN ENDOSCOPIC PROCEDURE TODAY AT [facility]:   Refer to the procedure report that was given to you for any specific questions about what was found during the examination.  If the procedure report does not answer your questions, please call your gastroenterologist to clarify.  If you requested that your care partner not be given the details of your procedure findings, then the procedure report has been included in a sealed envelope for you to review at your convenience later.  YOU SHOULD EXPECT: Some feelings of bloating in the abdomen. Passage of more gas than usual.  Walking can help get rid of the air that was put into your GI tract during the procedure and reduce the bloating. If you had a lower endoscopy (such as a colonoscopy or flexible sigmoidoscopy) you may notice spotting of blood in your stool or on the toilet paper. If you underwent a bowel prep for your procedure, you may not have a normal bowel movement for a few days.  Please Note:  You might notice some irritation and congestion in your nose or some drainage.  This is from the oxygen used during your procedure.  There is no need for concern and it should clear up in a day or so.  SYMPTOMS TO REPORT IMMEDIATELY:   Following lower endoscopy (colonoscopy or flexible sigmoidoscopy):  Excessive amounts of blood in the stool  Significant tenderness or worsening of abdominal pains  Swelling of the abdomen that is new, acute  Fever of 100F or higher For urgent or emergent issues, a gastroenterologist can be reached at any  hour by calling [phone].   DIET:  We do recommend a small meal at first, but then you may proceed to your regular diet.  Drink plenty of fluids but you should avoid alcoholic beverages for 24 hours.  ACTIVITY:  You should plan to take it easy for the rest of today and you should NOT DRIVE or use heavy machinery until tomorrow (because of the sedation medicines used during the test).    FOLLOW UP: Our staff will call the number listed on your records the next business day following your procedure to check on you and address any questions or concerns that you may have regarding the information given to you following your procedure. If we do not reach you, we will leave a message.  However, if you are feeling well and you are not experiencing any problems, there is no need to return our call.  We will assume that you have returned to your regular daily activities without incident.  If any biopsies were taken you will be contacted by phone or by letter within the next 1-3 weeks.  Please call us at [phone] if you have not heard about the biopsies in 3 weeks.    SIGNATURES/CONFIDENTIALITY: You and/or your care partner have signed paperwork which will be entered into your electronic medical record.  These signatures attest to the fact that that the information above on your After Visit Summary has been reviewed and is understood.  Full responsibility of the confidentiality of this discharge information lies with you and/or your care-partner.

## 2017-10-13 NOTE — Progress Notes (Signed)
A/ox3 pleased with MAC, report to Jane RN 

## 2017-10-13 NOTE — Progress Notes (Signed)
Called to room to assist during endoscopic procedure.  Patient ID and intended procedure confirmed with present staff. Received instructions for my participation in the procedure from the performing physician.  

## 2017-10-14 ENCOUNTER — Telehealth: Payer: Self-pay | Admitting: *Deleted

## 2017-10-14 NOTE — Telephone Encounter (Signed)
Left message on f/u call 

## 2017-10-14 NOTE — Telephone Encounter (Signed)
No answer, message left for the patient. 

## 2017-10-18 ENCOUNTER — Encounter: Payer: Self-pay | Admitting: Gastroenterology

## 2017-12-06 ENCOUNTER — Other Ambulatory Visit: Payer: Self-pay | Admitting: Family Medicine

## 2018-05-22 ENCOUNTER — Encounter: Payer: Self-pay | Admitting: Family Medicine

## 2018-05-22 ENCOUNTER — Ambulatory Visit: Payer: 59 | Admitting: Family Medicine

## 2018-05-22 VITALS — BP 108/82 | HR 71 | Temp 98.2°F | Resp 18 | Ht 72.0 in | Wt 217.4 lb

## 2018-05-22 DIAGNOSIS — B309 Viral conjunctivitis, unspecified: Secondary | ICD-10-CM | POA: Diagnosis not present

## 2018-05-22 MED ORDER — KETOROLAC TROMETHAMINE 0.5 % OP SOLN: 1.0000 [drp] | Freq: Four times a day (QID) | OPHTHALMIC | 0 refills | Status: DC | PRN

## 2018-05-22 NOTE — Progress Notes (Signed)
Patient ID: Evan Strickland, male    DOB: 10/28/1966, 52 y.o.   MRN: [id number]  PCP: Susy Frizzle, MD  Chief Complaint  Patient presents with  . right eye discomfort    noticed symptoms last night    Subjective:   Evan Strickland is a 52 y.o. male, presents to clinic with CC of right eye pain onset last night, and had sensation of foreign body, with mild irritation, redness and clear drainage.  Symptoms have persisted today.  He denies any severe pain, photophobia, blurry vision.   He has some associated right-sided nasal drainage and ear irritation.  He denies any itching.  No trauma or situation where he felt a foreign body flew in his eye.  Vision is slightly more blurry because of drainage and irritation.    Patient Active Problem List   Diagnosis Date Noted  . HTN (hypertension) 11/07/2015  . HLD (hyperlipidemia) 11/07/2015  . Acute sinusitis 10/05/2013  . Acute URI 10/05/2013  . Acute appendicitis 04/01/2013  . SHOULDER PAIN, RIGHT 01/31/2010     Prior to Admission medications   Medication Sig Start Date End Date Taking? Authorizing Provider  aspirin EC 81 MG tablet Take 81 mg by mouth daily.   Yes [provider]  benazepril (LOTENSIN) 10 MG tablet TAKE 1 TABLET BY MOUTH DAILY 12/08/17  Yes Susy Frizzle, MD  fish oil-omega-3 fatty acids 1000 MG capsule Take 1 g by mouth 2 (two) times daily.   Yes [provider]     No Known Allergies   Family History  Problem Relation Age of Onset  . Heart disease Mother   . Diabetes Sister        Type II  . Glaucoma Maternal Grandmother   . Colon cancer Neg Hx   . Stomach cancer Neg Hx   . Esophageal cancer Neg Hx   . Rectal cancer Neg Hx      Social History   Socioeconomic History  . Marital status: Married    Spouse name: Not on file  . Number of children: Not on file  . Years of education: Not on file  . Highest education level: Not on file  Occupational History  . Not on file    Social Needs  . Financial resource strain: Not on file  . Food insecurity:    Worry: Not on file    Inability: Not on file  . Transportation needs:    Medical: Not on file    Non-medical: Not on file  Tobacco Use  . Smoking status: Never Smoker  . Smokeless tobacco: Never Used  Substance and Sexual Activity  . Alcohol use: Yes    Comment: occ  . Drug use: No  . Sexual activity: Not on file  Lifestyle  . Physical activity:    Days per week: Not on file    Minutes per session: Not on file  . Stress: Not on file  Relationships  . Social connections:    Talks on phone: Not on file    Gets together: Not on file    Attends religious service: Not on file    Active member of club or organization: Not on file    Attends meetings of clubs or organizations: Not on file    Relationship status: Not on file  . Intimate partner violence:    Fear of current or ex partner: Not on file    Emotionally abused: Not on file  Physically abused: Not on file    Forced sexual activity: Not on file  Other Topics Concern  . Not on file  Social History Narrative  . Not on file     Review of Systems  Constitutional: Negative.   HENT: Positive for rhinorrhea. Negative for congestion, dental problem, ear discharge, ear pain, facial swelling, sinus pressure, sinus pain, sneezing, sore throat, tinnitus, trouble swallowing and voice change.   Respiratory: Negative.   Cardiovascular: Negative.   Gastrointestinal: Negative.   Musculoskeletal: Negative.   Skin: Negative for color change, pallor and rash.  Neurological: Negative for dizziness, weakness and headaches.  Hematological: Negative for adenopathy.  Psychiatric/Behavioral: Negative.        Objective:    Vitals:   05/22/18 1005  BP: 108/82  Pulse: 71  Resp: 18  Temp: 98.2 F (36.8 C)  TempSrc: Oral  SpO2: 97%  Weight: 217 lb 6.4 oz (98.6 kg)  Height: 6' (1.829 m)      Physical Exam  Eyes: Pupils are equal, round, and  reactive to light. EOM and lids are normal. Right eye exhibits no chemosis, no exudate and no hordeolum. No foreign body present in the right eye. Left eye exhibits no chemosis, no discharge, no exudate and no hordeolum. No foreign body present in the left eye. Right conjunctiva is injected. Right conjunctiva has no hemorrhage. Left conjunctiva is not injected. Left conjunctiva has no hemorrhage. No scleral icterus. Right eye exhibits normal extraocular motion and no nystagmus. Left eye exhibits normal extraocular motion and no nystagmus.  Slit lamp exam:      The right eye shows no corneal abrasion and no fluorescein uptake.  Right conjunctival nasal aspect, palpable conjunctive erythematous, no purulent discharge, mild clear tearing No visible foreign body No eyelid edema, erythema no periorbital edema or erythema PERRLA  Nursing note and vitals reviewed.   Vision Screening  Edited by: Vonna Kotyk A, CMA   Right eye Left eye Both eyes  Without correction 20/50 20/30 20/20   Comments: Has glasses, but not with him today          Assessment & Plan:      ICD-10-CM   1. Viral conjunctivitis of right eye B30.9 ketorolac (ACULAR) 0.5 % ophthalmic solution     Irritation, redness to right eye sensation of foreign body Fluorescein exam negative Visual acuity mildly decreased in right eye, but no concerning decrease from left, additionally he does wear prescriptive lenses and baseline vision unknown.  Does not wear contact lenses. Otherwise his exam is reassuring with normal EOMs, PERRLA, and no concerns for any surrounding infection like preseptal or orbital cellulitis.  Treat with cool compresses, OTC eye drops and sent tordal eye drops if affordable to try for eye irritation.  Good hand hygiene discusses, commonly spreads to other eye, self limiting.  Pt verbalized understanding and did not want AVS.   Delsa Grana, PA-C 05/22/18 11:05 AM

## 2018-11-10 ENCOUNTER — Other Ambulatory Visit: Payer: 59

## 2018-11-10 DIAGNOSIS — Z125 Encounter for screening for malignant neoplasm of prostate: Secondary | ICD-10-CM | POA: Diagnosis not present

## 2018-11-10 DIAGNOSIS — Z0000 Encounter for general adult medical examination without abnormal findings: Secondary | ICD-10-CM

## 2018-11-10 DIAGNOSIS — I10 Essential (primary) hypertension: Secondary | ICD-10-CM | POA: Diagnosis not present

## 2018-11-10 DIAGNOSIS — E781 Pure hyperglyceridemia: Secondary | ICD-10-CM | POA: Diagnosis not present

## 2018-11-10 LAB — LIPID PANEL
Cholesterol: 239 mg/dL — ABNORMAL HIGH (ref <200)
HDL: 54 mg/dL (ref >40)
LDL Cholesterol (Calc): 153 mg/dL (calc) — ABNORMAL HIGH
Non-HDL Cholesterol (Calc): 185 mg/dL (calc) — ABNORMAL HIGH (ref <130)
Total CHOL/HDL Ratio: 4.4 (calc) (ref <5.0)
Triglycerides: 185 mg/dL — ABNORMAL HIGH (ref <150)

## 2018-11-10 LAB — CBC WITH DIFFERENTIAL/PLATELET
ABSOLUTE MONOCYTES: 575 {cells}/uL (ref 200–950)
BASOS PCT: 0.6 %
Basophils Absolute: 30 cells/uL (ref 0–200)
Eosinophils Absolute: 190 cells/uL (ref 15–500)
Eosinophils Relative: 3.8 %
HEMATOCRIT: 42.7 % (ref 38.5–50.0)
HEMOGLOBIN: 15.1 g/dL (ref 13.2–17.1)
LYMPHS ABS: 1805 {cells}/uL (ref 850–3900)
MCH: 33.2 pg — ABNORMAL HIGH (ref 27.0–33.0)
MCHC: 35.4 g/dL (ref 32.0–36.0)
MCV: 93.8 fL (ref 80.0–100.0)
MPV: 10.9 fL (ref 7.5–12.5)
Monocytes Relative: 11.5 %
Neutro Abs: 2400 cells/uL (ref 1500–7800)
Neutrophils Relative %: 48 %
Platelets: 168 10*3/uL (ref 140–400)
RBC: 4.55 10*6/uL (ref 4.20–5.80)
RDW: 12.6 % (ref 11.0–15.0)
Total Lymphocyte: 36.1 %
WBC: 5 10*3/uL (ref 3.8–10.8)

## 2018-11-10 LAB — COMPREHENSIVE METABOLIC PANEL
AG Ratio: 1.9 (calc) (ref 1.0–2.5)
ALBUMIN MSPROF: 4.7 g/dL (ref 3.6–5.1)
ALT: 38 U/L (ref 9–46)
AST: 32 U/L (ref 10–35)
Alkaline phosphatase (APISO): 47 U/L (ref 40–115)
BUN: 17 mg/dL (ref 7–25)
CHLORIDE: 102 mmol/L (ref 98–110)
CO2: 27 mmol/L (ref 20–32)
CREATININE: 1.04 mg/dL (ref 0.70–1.33)
Calcium: 9.9 mg/dL (ref 8.6–10.3)
GLOBULIN: 2.5 g/dL (ref 1.9–3.7)
Glucose, Bld: 100 mg/dL — ABNORMAL HIGH (ref 65–99)
Potassium: 4.8 mmol/L (ref 3.5–5.3)
Sodium: 136 mmol/L (ref 135–146)
Total Bilirubin: 0.7 mg/dL (ref 0.2–1.2)
Total Protein: 7.2 g/dL (ref 6.1–8.1)

## 2018-11-10 LAB — PSA: PSA: 0.5 ng/mL (ref <=4.0)

## 2018-11-13 ENCOUNTER — Ambulatory Visit (INDEPENDENT_AMBULATORY_CARE_PROVIDER_SITE_OTHER): Payer: 59 | Admitting: Family Medicine

## 2018-11-13 ENCOUNTER — Encounter: Payer: Self-pay | Admitting: Family Medicine

## 2018-11-13 VITALS — BP 120/84 | HR 77 | Temp 98.2°F | Resp 14 | Ht 72.0 in | Wt 228.0 lb

## 2018-11-13 DIAGNOSIS — Z23 Encounter for immunization: Secondary | ICD-10-CM | POA: Diagnosis not present

## 2018-11-13 DIAGNOSIS — I10 Essential (primary) hypertension: Secondary | ICD-10-CM | POA: Diagnosis not present

## 2018-11-13 DIAGNOSIS — E7800 Pure hypercholesterolemia, unspecified: Secondary | ICD-10-CM

## 2018-11-13 DIAGNOSIS — Z0000 Encounter for general adult medical examination without abnormal findings: Secondary | ICD-10-CM | POA: Diagnosis not present

## 2018-11-13 MED ORDER — BENAZEPRIL HCL 10 MG PO TABS: 10.0000 mg | ORAL_TABLET | Freq: Every day | ORAL | 3 refills | Status: DC

## 2018-11-13 MED ORDER — SILDENAFIL CITRATE 100 MG PO TABS: 50.0000 mg | ORAL_TABLET | Freq: Every day | ORAL | 11 refills | Status: DC | PRN

## 2018-11-13 NOTE — Addendum Note (Signed)
Addended by: Shary Decamp B on: 11/13/2018 11:52 AM   Modules accepted: Orders

## 2018-11-13 NOTE — Progress Notes (Signed)
Subjective:    Patient ID: Evan Strickland, male    DOB: [birth year], 53 y.o.   MRN: [id number]  HPI  Patient is a very pleasant 53 year old white male here today for complete physical exam.  His last colonoscopy was performed in 2018 and was significant for 3 sessile polyps.  It is recommended he have a repeat colonoscopy in 5 years.  He is due for a PSA to screen for prostate cancer.  He is due for a flu shot.  His tetanus shot was administered to him at work.  His most recent lab work is listed below.  He also reports erectile dysfunction.  At times he has a difficult time achieving an erection.  At other times he has a difficult time maintaining an erection patient enough to achieve orgasm.  He is interested in medical options to treat this Lab on 11/10/2018  Component Date Value Ref Range Status  . WBC 11/10/2018 5.0  3.8 - 10.8 Thousand/uL Final  . RBC 11/10/2018 4.55  4.20 - 5.80 Million/uL Final  . Hemoglobin 11/10/2018 15.1  13.2 - 17.1 g/dL Final  . HCT 11/10/2018 42.7  38.5 - 50.0 % Final  . MCV 11/10/2018 93.8  80.0 - 100.0 fL Final  . MCH 11/10/2018 33.2* 27.0 - 33.0 pg Final  . MCHC 11/10/2018 35.4  32.0 - 36.0 g/dL Final  . RDW 11/10/2018 12.6  11.0 - 15.0 % Final  . Platelets 11/10/2018 168  140 - 400 Thousand/uL Final  . MPV 11/10/2018 10.9  7.5 - 12.5 fL Final  . Neutro Abs 11/10/2018 2,400  1,500 - 7,800 cells/uL Final  . Lymphs Abs 11/10/2018 1,805  850 - 3,900 cells/uL Final  . Absolute Monocytes 11/10/2018 575  200 - 950 cells/uL Final  . Eosinophils Absolute 11/10/2018 190  15 - 500 cells/uL Final  . Basophils Absolute 11/10/2018 30  0 - 200 cells/uL Final  . Neutrophils Relative % 11/10/2018 48  % Final  . Total Lymphocyte 11/10/2018 36.1  % Final  . Monocytes Relative 11/10/2018 11.5  % Final  . Eosinophils Relative 11/10/2018 3.8  % Final  . Basophils Relative 11/10/2018 0.6  % Final  . Glucose, Bld 11/10/2018 100* 65 - 99 mg/dL Final   Comment: .             Fasting reference interval . For someone without known diabetes, a glucose value between 100 and 125 mg/dL is consistent with prediabetes and should be confirmed with a follow-up test. .   . BUN 11/10/2018 17  7 - 25 mg/dL Final  . Creat 11/10/2018 1.04  0.70 - 1.33 mg/dL Final   Comment: For patients >41 years of age, the reference limit for Creatinine is approximately 13% higher for people identified as African-American. .   Havery Moros Ratio 93/79/0240 NOT APPLICABLE  6 - 22 (calc) Final  . Sodium 11/10/2018 136  135 - 146 mmol/L Final  . Potassium 11/10/2018 4.8  3.5 - 5.3 mmol/L Final  . Chloride 11/10/2018 102  98 - 110 mmol/L Final  . CO2 11/10/2018 27  20 - 32 mmol/L Final  . Calcium 11/10/2018 9.9  8.6 - 10.3 mg/dL Final  . Total Protein 11/10/2018 7.2  6.1 - 8.1 g/dL Final  . Albumin 11/10/2018 4.7  3.6 - 5.1 g/dL Final  . Globulin 11/10/2018 2.5  1.9 - 3.7 g/dL (calc) Final  . AG Ratio 11/10/2018 1.9  1.0 - 2.5 (calc) Final  . Total Bilirubin 11/10/2018 0.7  0.2 - 1.2 mg/dL Final  . Alkaline phosphatase (APISO) 11/10/2018 47  40 - 115 U/L Final  . AST 11/10/2018 32  10 - 35 U/L Final  . ALT 11/10/2018 38  9 - 46 U/L Final  . Cholesterol 11/10/2018 239* <200 mg/dL Final  . HDL 11/10/2018 54  >40 mg/dL Final  . Triglycerides 11/10/2018 185* <150 mg/dL Final  . LDL Cholesterol (Calc) 11/10/2018 153* mg/dL (calc) Final   Comment: Reference range: <100 . Desirable range <100 mg/dL for primary prevention;   <70 mg/dL for patients with CHD or diabetic patients  with > or = 2 CHD risk factors. Marland Kitchen LDL-C is now calculated using the Martin-Hopkins  calculation, which is a validated novel method providing  better accuracy than the Friedewald equation in the  estimation of LDL-C.  Cresenciano Genre et al. Annamaria Helling. 4782;956(21): 2061-2068  (http://education.[facility].com/faq/FAQ164)   . Total CHOL/HDL Ratio 11/10/2018 4.4  <5.0 (calc) Final  . Non-HDL Cholesterol (Calc)  11/10/2018 185* <130 mg/dL (calc) Final   Comment: For patients with diabetes plus 1 major ASCVD risk  factor, treating to a non-HDL-C goal of <100 mg/dL  (LDL-C of <70 mg/dL) is considered a therapeutic  option.   Marland Kitchen PSA 11/10/2018 0.5  < OR = 4.0 ng/mL Final   Comment: The total PSA value from this assay system is  standardized against the WHO standard. The test  result will be approximately 20% lower when compared  to the equimolar-standardized total PSA (Beckman  Coulter). Comparison of serial PSA results should be  interpreted with this fact in mind. . This test was performed using the Siemens  chemiluminescent method. Values obtained from  different assay methods cannot be used interchangeably. PSA levels, regardless of value, should not be interpreted as absolute evidence of the presence or absence of disease.    Past Medical History:  Diagnosis Date  . Hyperlipidemia   . Hypertension    Past Surgical History:  Procedure Laterality Date  . APPENDECTOMY  04/01/13  . DECOMPRESSION FASCIOTOMY LEG Right   . LAPAROSCOPIC APPENDECTOMY N/A 04/01/2013   Procedure: APPENDECTOMY LAPAROSCOPIC;  Surgeon: Rolm Bookbinder, MD;  Location: [facility];  Service: General;  Laterality: N/A;  . SHOULDER SURGERY Bilateral    Current Outpatient Medications on File Prior to Visit  Medication Sig Dispense Refill  . aspirin EC 81 MG tablet Take 81 mg by mouth daily.    . fish oil-omega-3 fatty acids 1000 MG capsule Take 1 g by mouth 2 (two) times daily.     No current facility-administered medications on file prior to visit.    No Known Allergies Social History   Socioeconomic History  . Marital status: Married    Spouse name: Not on file  . Number of children: Not on file  . Years of education: Not on file  . Highest education level: Not on file  Occupational History  . Not on file  Social Needs  . Financial resource strain: Not on file  . Food insecurity:    Worry: Not on file     Inability: Not on file  . Transportation needs:    Medical: Not on file    Non-medical: Not on file  Tobacco Use  . Smoking status: Never Smoker  . Smokeless tobacco: Never Used  Substance and Sexual Activity  . Alcohol use: Yes    Comment: occ  . Drug use: No  . Sexual activity: Not on file  Lifestyle  . Physical activity:    Days  per week: Not on file    Minutes per session: Not on file  . Stress: Not on file  Relationships  . Social connections:    Talks on phone: Not on file    Gets together: Not on file    Attends religious service: Not on file    Active member of club or organization: Not on file    Attends meetings of clubs or organizations: Not on file    Relationship status: Not on file  . Intimate partner violence:    Fear of current or ex partner: Not on file    Emotionally abused: Not on file    Physically abused: Not on file    Forced sexual activity: Not on file  Other Topics Concern  . Not on file  Social History Narrative  . Not on file   Family History  Problem Relation Age of Onset  . Heart disease Mother   . Diabetes Sister        Type II  . Glaucoma Maternal Grandmother   . Colon cancer Neg Hx   . Stomach cancer Neg Hx   . Esophageal cancer Neg Hx   . Rectal cancer Neg Hx      Review of Systems  All other systems reviewed and are negative.      Objective:   Physical Exam  Constitutional: He is oriented to person, place, and time. He appears well-developed and well-nourished. No distress.  HENT:  Head: Normocephalic and atraumatic.  Right Ear: External ear normal.  Left Ear: External ear normal.  Nose: Nose normal.  Mouth/Throat: Oropharynx is clear and moist. No oropharyngeal exudate.  Eyes: Pupils are equal, round, and reactive to light. Conjunctivae and EOM are normal. Right eye exhibits no discharge. Left eye exhibits no discharge. No scleral icterus.  Neck: Normal range of motion. Neck supple. No JVD present. No tracheal deviation  present. No thyromegaly present.  Cardiovascular: Normal rate, regular rhythm, normal heart sounds and intact distal pulses. Exam reveals no gallop and no friction rub.  No murmur heard. Pulmonary/Chest: Effort normal and breath sounds normal. No stridor. No respiratory distress. He has no wheezes. He has no rales. He exhibits no tenderness.  Abdominal: Soft. Bowel sounds are normal. He exhibits no distension and no mass. There is no abdominal tenderness. There is no rebound and no guarding.  Genitourinary:    Penis normal.   Musculoskeletal: Normal range of motion.        General: No tenderness or edema.  Lymphadenopathy:    He has no cervical adenopathy.  Neurological: He is alert and oriented to person, place, and time. He has normal reflexes. No cranial nerve deficit. He exhibits normal muscle tone. Coordination normal.  Skin: Skin is warm. No rash noted. He is not diaphoretic. No erythema. No pallor.  Psychiatric: He has a normal mood and affect. His behavior is normal. Judgment and thought content normal.  Vitals reviewed.         Assessment & Plan:  Routine general medical examination at a health care facility  Essential hypertension  Pure hypercholesterolemia   Patient's physical exam is completely normal. Blood pressure is excellent. Cholesterol is no longer a goal.  This is similar to his cholesterol from last year.  Despite taking Rosebud Poles, his cholesterol is still beyond his goal.  Given his family history of coronary disease in his mother I have recommended a statin.  Patient would like to try aggressive dietary changes first and then recheck his  cholesterol and CMP in 3 months.  If still elevated at that point he would consider a statin.  PSA is excellent.  Colon cancer screening is up-to-date.  He received his flu shot.  We will try Viagra 50 to 100 mg p.o. daily as needed erectile dysfunction.

## 2019-02-12 ENCOUNTER — Other Ambulatory Visit: Payer: 59

## 2019-06-21 ENCOUNTER — Other Ambulatory Visit: Payer: Self-pay

## 2019-06-21 ENCOUNTER — Ambulatory Visit (INDEPENDENT_AMBULATORY_CARE_PROVIDER_SITE_OTHER): Payer: 59 | Admitting: Family Medicine

## 2019-06-21 DIAGNOSIS — J069 Acute upper respiratory infection, unspecified: Secondary | ICD-10-CM | POA: Diagnosis not present

## 2019-06-21 NOTE — Progress Notes (Signed)
Subjective:    Patient ID: Evan Strickland, male    DOB: 06/29/1966, 53 y.o.   MRN: [id number]  HPI  Patient is being seen today as a telephone visit.  Phone call began at 350.  Phone call concluded 4:00.  Patient agrees to be seen by telephone.  Patient states that 2 days ago he developed itchy watery eyes.  Yesterday morning he woke up with a head cold.  Symptoms include rhinorrhea, head congestion, and a cough.  He denies any fever.  He denies any shortness of breath.  He denies any chest pain.  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  His wife is a Radio producer.  Therefore if 1 of the 2 individuals tested positive for COVID.  Numerous other potential contacts would be at risk who are first-line workers.  Denies any other systemic symptoms. Past Medical History:  Diagnosis Date  . Hyperlipidemia   . Hypertension    Past Surgical History:  Procedure Laterality Date  . APPENDECTOMY  04/01/13  . DECOMPRESSION FASCIOTOMY LEG Right   . LAPAROSCOPIC APPENDECTOMY N/A 04/01/2013   Procedure: APPENDECTOMY LAPAROSCOPIC;  Surgeon: Rolm Bookbinder, MD;  Location: [facility];  Service: General;  Laterality: N/A;  . SHOULDER SURGERY Bilateral    Current Outpatient Medications on File Prior to Visit  Medication Sig Dispense Refill  . aspirin EC 81 MG tablet Take 81 mg by mouth daily.    . benazepril (LOTENSIN) 10 MG tablet Take 1 tablet (10 mg total) by mouth daily. 90 tablet 3  . fish oil-omega-3 fatty acids 1000 MG capsule Take 1 g by mouth 2 (two) times daily.    . sildenafil (VIAGRA) 100 MG tablet Take 0.5-1 tablets (50-100 mg total) by mouth daily as needed for erectile dysfunction. 5 tablet 11   No current facility-administered medications on file prior to visit.    No Known Allergies Social History   Socioeconomic History  . Marital status: Married    Spouse name: Not on file  . Number of children: Not on file  . Years of education:  Not on file  . Highest education level: Not on file  Occupational History  . Not on file  Social Needs  . Financial resource strain: Not on file  . Food insecurity    Worry: Not on file    Inability: Not on file  . Transportation needs    Medical: Not on file    Non-medical: Not on file  Tobacco Use  . Smoking status: Never Smoker  . Smokeless tobacco: Never Used  Substance and Sexual Activity  . Alcohol use: Yes    Comment: occ  . Drug use: No  . Sexual activity: Not on file  Lifestyle  . Physical activity    Days per week: Not on file    Minutes per session: Not on file  . Stress: Not on file  Relationships  . Social Herbalist on phone: Not on file    Gets together: Not on file    Attends religious service: Not on file    Active member of club or organization: Not on file    Attends meetings of clubs or organizations: Not on file    Relationship status: Not on file  . Intimate partner violence    Fear of current or ex partner: Not on file    Emotionally abused: Not on file    Physically abused: Not on  file    Forced sexual activity: Not on file  Other Topics Concern  . Not on file  Social History Narrative  . Not on file     Review of Systems  All other systems reviewed and are negative.      Objective:   Physical Exam  Physical exam cannot be performed today as patient was seen as a telephone visit.  However he is speaking full and complete sentences without any respiratory distress.  He is audibly congested and having audible nasal congestion however there is no respiratory distress.  He coughs occasionally during her visit.      Assessment & Plan:  The encounter diagnosis was URI, acute. Symptoms are consistent with a viral upper respiratory infection.  Patient symptoms are very mild.  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  I recommended quarantine at home until his test have returned negative.  If his test returned negative, I would treat the patient as a basic head cold with Coricidin HBP over-the-counter for head congestion.  Patient is in agreement with this plan.  He will quarantine until test results have returned

## 2019-06-22 ENCOUNTER — Other Ambulatory Visit: Payer: Self-pay

## 2019-06-22 DIAGNOSIS — Z20822 Contact with and (suspected) exposure to covid-19: Secondary | ICD-10-CM

## 2019-06-23 LAB — NOVEL CORONAVIRUS, NAA: SARS-CoV-2, NAA: NOT DETECTED

## 2019-06-24 ENCOUNTER — Other Ambulatory Visit: Payer: Self-pay | Admitting: Family Medicine

## 2019-06-24 MED ORDER — HYDROCODONE-HOMATROPINE 5-1.5 MG/5ML PO SYRP: 5.0000 mL | ORAL_SOLUTION | Freq: Three times a day (TID) | ORAL | 0 refills | Status: DC | PRN

## 2019-10-26 ENCOUNTER — Other Ambulatory Visit: Payer: Self-pay | Admitting: *Deleted

## 2019-10-26 MED ORDER — BENAZEPRIL HCL 10 MG PO TABS: 10.0000 mg | ORAL_TABLET | Freq: Every day | ORAL | 3 refills | Status: DC

## 2019-11-22 ENCOUNTER — Other Ambulatory Visit: Payer: Self-pay | Admitting: Family Medicine

## 2019-11-22 NOTE — Telephone Encounter (Signed)
Ok to refill 

## 2019-12-01 ENCOUNTER — Telehealth: Payer: Self-pay | Admitting: Family Medicine

## 2019-12-01 NOTE — Telephone Encounter (Signed)
PA Submitted through [email or web address] and received the following:  Your information has been submitted to [facility]. To check for an updated outcome later, reopen this PA request from your dashboard.  If [facility] has not responded to your request within 24 hours, contact [facility] at [phone]. If you think there may be a problem with your PA request, use our live chat feature at the bottom right.

## 2019-12-02 MED ORDER — SILDENAFIL CITRATE 100 MG PO TABS: ORAL_TABLET | ORAL | 3 refills | Status: DC

## 2019-12-02 NOTE — Telephone Encounter (Signed)
Ok to go to [facility]

## 2019-12-02 NOTE — Telephone Encounter (Signed)
Call placed to patient and patient made aware.   Requested to ha ve prescription and coupon printed to pick up.   Printed.

## 2019-12-02 NOTE — Telephone Encounter (Signed)
Received PA determination.   PA denied as medication is not covered/ plan exclusion.   MD please advise.   Of note, out of pocket cost for #30 tabs of Sildenafil 100mg  at [facility] >$30.00.

## 2020-07-25 ENCOUNTER — Other Ambulatory Visit: Payer: Self-pay

## 2020-07-25 ENCOUNTER — Other Ambulatory Visit: Payer: BC Managed Care – PPO

## 2020-07-25 DIAGNOSIS — Z0000 Encounter for general adult medical examination without abnormal findings: Secondary | ICD-10-CM

## 2020-07-25 DIAGNOSIS — I10 Essential (primary) hypertension: Secondary | ICD-10-CM

## 2020-07-25 DIAGNOSIS — Z111 Encounter for screening for respiratory tuberculosis: Secondary | ICD-10-CM

## 2020-07-25 DIAGNOSIS — E7800 Pure hypercholesterolemia, unspecified: Secondary | ICD-10-CM

## 2020-07-26 LAB — COMPLETE METABOLIC PANEL WITH GFR
AG Ratio: 2 (calc) (ref 1.0–2.5)
ALT: 31 U/L (ref 9–46)
AST: 24 U/L (ref 10–35)
Albumin: 4.7 g/dL (ref 3.6–5.1)
Alkaline phosphatase (APISO): 44 U/L (ref 35–144)
BUN: 18 mg/dL (ref 7–25)
CO2: 28 mmol/L (ref 20–32)
Calcium: 9.9 mg/dL (ref 8.6–10.3)
Chloride: 101 mmol/L (ref 98–110)
Creat: 1.07 mg/dL (ref 0.70–1.33)
GFR, Est African American: 92 mL/min/{1.73_m2} (ref >=60)
GFR, Est Non African American: 79 mL/min/{1.73_m2} (ref >=60)
Globulin: 2.4 g/dL (calc) (ref 1.9–3.7)
Glucose, Bld: 92 mg/dL (ref 65–99)
Potassium: 4.4 mmol/L (ref 3.5–5.3)
Sodium: 138 mmol/L (ref 135–146)
Total Bilirubin: 0.9 mg/dL (ref 0.2–1.2)
Total Protein: 7.1 g/dL (ref 6.1–8.1)

## 2020-07-26 LAB — PSA: PSA: 0.48 ng/mL (ref <=4.0)

## 2020-07-26 LAB — LIPID PANEL
Cholesterol: 242 mg/dL — ABNORMAL HIGH (ref <200)
HDL: 55 mg/dL (ref >=40)
LDL Cholesterol (Calc): 154 mg/dL (calc) — ABNORMAL HIGH
Non-HDL Cholesterol (Calc): 187 mg/dL (calc) — ABNORMAL HIGH (ref <130)
Total CHOL/HDL Ratio: 4.4 (calc) (ref <5.0)
Triglycerides: 190 mg/dL — ABNORMAL HIGH (ref <150)

## 2020-07-26 LAB — CBC WITH DIFFERENTIAL/PLATELET
Absolute Monocytes: 535 cells/uL (ref 200–950)
Basophils Absolute: 48 cells/uL (ref 0–200)
Basophils Relative: 0.9 %
Eosinophils Absolute: 138 cells/uL (ref 15–500)
Eosinophils Relative: 2.6 %
HCT: 44.1 % (ref 38.5–50.0)
Hemoglobin: 15.2 g/dL (ref 13.2–17.1)
Lymphs Abs: 2046 cells/uL (ref 850–3900)
MCH: 32.5 pg (ref 27.0–33.0)
MCHC: 34.5 g/dL (ref 32.0–36.0)
MCV: 94.2 fL (ref 80.0–100.0)
MPV: 10.6 fL (ref 7.5–12.5)
Monocytes Relative: 10.1 %
Neutro Abs: 2533 cells/uL (ref 1500–7800)
Neutrophils Relative %: 47.8 %
Platelets: 171 10*3/uL (ref 140–400)
RBC: 4.68 10*6/uL (ref 4.20–5.80)
RDW: 12.9 % (ref 11.0–15.0)
Total Lymphocyte: 38.6 %
WBC: 5.3 10*3/uL (ref 3.8–10.8)

## 2020-07-27 LAB — QUANTIFERON-TB GOLD PLUS
Mitogen-NIL: 9.84 IU/mL
NIL: 0.02 IU/mL
QuantiFERON-TB Gold Plus: NEGATIVE
TB1-NIL: 0.19 IU/mL
TB2-NIL: 0.18 IU/mL

## 2020-07-28 ENCOUNTER — Encounter: Payer: Self-pay | Admitting: Family Medicine

## 2020-07-28 ENCOUNTER — Ambulatory Visit: Payer: BC Managed Care – PPO | Admitting: Family Medicine

## 2020-07-28 ENCOUNTER — Other Ambulatory Visit: Payer: Self-pay

## 2020-07-28 VITALS — BP 118/80 | HR 68 | Temp 97.6°F | Resp 18 | Ht 72.0 in | Wt 225.0 lb

## 2020-07-28 DIAGNOSIS — E7800 Pure hypercholesterolemia, unspecified: Secondary | ICD-10-CM | POA: Diagnosis not present

## 2020-07-28 DIAGNOSIS — I10 Essential (primary) hypertension: Secondary | ICD-10-CM

## 2020-07-28 DIAGNOSIS — Z0000 Encounter for general adult medical examination without abnormal findings: Secondary | ICD-10-CM

## 2020-07-28 DIAGNOSIS — Z0001 Encounter for general adult medical examination with abnormal findings: Secondary | ICD-10-CM | POA: Diagnosis not present

## 2020-07-28 MED ORDER — SILDENAFIL CITRATE 100 MG PO TABS: ORAL_TABLET | ORAL | 6 refills | Status: DC

## 2020-07-28 NOTE — Progress Notes (Signed)
Subjective:    Patient ID: Evan Strickland, male    DOB: 03/09/1966, 54 y.o.   MRN: [id number]  HPI  Patient is a very pleasant 54 year old white male here today for complete physical exam.  His last colonoscopy was performed in 2018 and was significant for 3 sessile polyps.  It is recommended he have a repeat colonoscopy in 5 years.  Patient's cholesterol is checked today.  I calculated his 10-year risk of cardiovascular disease to be approximately 5%.  We discussed starting a statin given his elevated cholesterol and he would like to try fish oil 2000 mg a day and watching his diet.  However he is also using nicotine in the form of smokeless tobacco.  I recommended that he discontinue that immediately.  Blood pressure today is excellent.  He would like a refill on his Viagra that he uses for erectile dysfunction.  He is due for his flu shot.  He has had his Covid vaccination. Lab on 07/25/2020  Component Date Value Ref Range Status  . PSA 07/25/2020 0.48  < OR = 4.0 ng/mL Final   Comment: The total PSA value from this assay system is  standardized against the WHO standard. The test  result will be approximately 20% lower when compared  to the equimolar-standardized total PSA (Beckman  Coulter). Comparison of serial PSA results should be  interpreted with this fact in mind. . This test was performed using the Siemens  chemiluminescent method. Values obtained from  different assay methods cannot be used interchangeably. PSA levels, regardless of value, should not be interpreted as absolute evidence of the presence or absence of disease.   . Cholesterol 07/25/2020 242* <200 mg/dL Final  . HDL 07/25/2020 55  > OR = 40 mg/dL Final  . Triglycerides 07/25/2020 190* <150 mg/dL Final  . LDL Cholesterol (Calc) 07/25/2020 154* mg/dL (calc) Final   Comment: Reference range: <100 . Desirable range <100 mg/dL for primary prevention;   <70 mg/dL for patients with CHD or diabetic patients  with >  or = 2 CHD risk factors. Marland Kitchen LDL-C is now calculated using the Martin-Hopkins  calculation, which is a validated novel method providing  better accuracy than the Friedewald equation in the  estimation of LDL-C.  Cresenciano Genre et al. Annamaria Helling. 2409;735(32): 2061-2068  (http://education.[facility].com/faq/FAQ164)   . Total CHOL/HDL Ratio 07/25/2020 4.4  <5.0 (calc) Final  . Non-HDL Cholesterol (Calc) 07/25/2020 187* <130 mg/dL (calc) Final   Comment: For patients with diabetes plus 1 major ASCVD risk  factor, treating to a non-HDL-C goal of <100 mg/dL  (LDL-C of <70 mg/dL) is considered a therapeutic  option.   . Glucose, Bld 07/25/2020 92  65 - 99 mg/dL Final   Comment: .            Fasting reference interval .   . BUN 07/25/2020 18  7 - 25 mg/dL Final  . Creat 07/25/2020 1.07  0.70 - 1.33 mg/dL Final   Comment: For patients >31 years of age, the reference limit for Creatinine is approximately 13% higher for people identified as African-American. .   . GFR, Est Non African American 07/25/2020 79  > OR = 60 mL/min/1.46m2 Final  . GFR, Est African American 07/25/2020 92  > OR = 60 mL/min/1.9m2 Final  . BUN/Creatinine Ratio 99/24/2683 NOT APPLICABLE  6 - 22 (calc) Final  . Sodium 07/25/2020 138  135 - 146 mmol/L Final  . Potassium 07/25/2020 4.4  3.5 - 5.3 mmol/L Final  .  Chloride 07/25/2020 101  98 - 110 mmol/L Final  . CO2 07/25/2020 28  20 - 32 mmol/L Final  . Calcium 07/25/2020 9.9  8.6 - 10.3 mg/dL Final  . Total Protein 07/25/2020 7.1  6.1 - 8.1 g/dL Final  . Albumin 07/25/2020 4.7  3.6 - 5.1 g/dL Final  . Globulin 07/25/2020 2.4  1.9 - 3.7 g/dL (calc) Final  . AG Ratio 07/25/2020 2.0  1.0 - 2.5 (calc) Final  . Total Bilirubin 07/25/2020 0.9  0.2 - 1.2 mg/dL Final  . Alkaline phosphatase (APISO) 07/25/2020 44  35 - 144 U/L Final  . AST 07/25/2020 24  10 - 35 U/L Final  . ALT 07/25/2020 31  9 - 46 U/L Final  . WBC 07/25/2020 5.3  3.8 - 10.8 Thousand/uL Final  . RBC  07/25/2020 4.68  4.20 - 5.80 Million/uL Final  . Hemoglobin 07/25/2020 15.2  13.2 - 17.1 g/dL Final  . HCT 07/25/2020 44.1  38 - 50 % Final  . MCV 07/25/2020 94.2  80.0 - 100.0 fL Final  . MCH 07/25/2020 32.5  27.0 - 33.0 pg Final  . MCHC 07/25/2020 34.5  32.0 - 36.0 g/dL Final  . RDW 07/25/2020 12.9  11.0 - 15.0 % Final  . Platelets 07/25/2020 171  140 - 400 Thousand/uL Final  . MPV 07/25/2020 10.6  7.5 - 12.5 fL Final  . Neutro Abs 07/25/2020 2,533  1,500 - 7,800 cells/uL Final  . Lymphs Abs 07/25/2020 2,046  850 - 3,900 cells/uL Final  . Absolute Monocytes 07/25/2020 535  200 - 950 cells/uL Final  . Eosinophils Absolute 07/25/2020 138  15 - 500 cells/uL Final  . Basophils Absolute 07/25/2020 48  0 - 200 cells/uL Final  . Neutrophils Relative % 07/25/2020 47.8  % Final  . Total Lymphocyte 07/25/2020 38.6  % Final  . Monocytes Relative 07/25/2020 10.1  % Final  . Eosinophils Relative 07/25/2020 2.6  % Final  . Basophils Relative 07/25/2020 0.9  % Final  . QuantiFERON-TB Gold Plus 07/25/2020 NEGATIVE  NEGATIVE Final   Comment: Negative test result. M. tuberculosis complex  infection unlikely.   Marland Kitchen NIL 07/25/2020 0.02  IU/mL Final  . Mitogen-NIL 07/25/2020 9.84  IU/mL Final  . TB1-NIL 07/25/2020 0.19  IU/mL Final  . TB2-NIL 07/25/2020 0.18  IU/mL Final   Comment: . The Nil tube value reflects the background interferon gamma immune response of the patient's blood sample. This value has been subtracted from the patient's displayed TB and Mitogen results. . Lower than expected results with the Mitogen tube prevent false-negative Quantiferon readings by detecting a patient with a potential immune suppressive condition and/or suboptimal pre-analytical specimen handling. . The TB1 Antigen tube is coated with the M. tuberculosis-specific antigens designed to elicit responses from TB antigen primed CD4+ helper T-lymphocytes. . The TB2 Antigen tube is coated with the M.  tuberculosis-specific antigens designed to elicit responses from TB antigen primed CD4+ helper and CD8+ cytotoxic T-lymphocytes. . For additional information, please refer to https://education.quest[facility].com/faq/FAQ204 (This link is being provided for informational/ educational purposes only.) .    Past Medical History:  Diagnosis Date  . Hyperlipidemia   . Hypertension    Past Surgical History:  Procedure Laterality Date  . APPENDECTOMY  04/01/13  . DECOMPRESSION FASCIOTOMY LEG Right   . LAPAROSCOPIC APPENDECTOMY N/A 04/01/2013   Procedure: APPENDECTOMY LAPAROSCOPIC;  Surgeon: Rolm Bookbinder, MD;  Location: [place];  Service: General;  Laterality: N/A;  . SHOULDER SURGERY Bilateral    Current  Outpatient Medications on File Prior to Visit  Medication Sig Dispense Refill  . aspirin EC 81 MG tablet Take 81 mg by mouth daily.    . benazepril (LOTENSIN) 10 MG tablet Take 1 tablet (10 mg total) by mouth daily. 90 tablet 3  . fish oil-omega-3 fatty acids 1000 MG capsule Take 1 g by mouth 2 (two) times daily.    Marland Kitchen HYDROcodone-homatropine (HYCODAN) 5-1.5 MG/5ML syrup Take 5 mLs by mouth every 8 (eight) hours as needed for cough. 120 mL 0  . sildenafil (VIAGRA) 100 MG tablet TAKE 1/2 TO 1 TABLET(50 TO 100 MG) BY MOUTH DAILY AS NEEDED FOR ERECTILE DYSFUNCTION 30 tablet 3   No current facility-administered medications on file prior to visit.   No Known Allergies Social History   Socioeconomic History  . Marital status: Married    Spouse name: Not on file  . Number of children: Not on file  . Years of education: Not on file  . Highest education level: Not on file  Occupational History  . Not on file  Tobacco Use  . Smoking status: Never Smoker  . Smokeless tobacco: Never Used  Vaping Use  . Vaping Use: Never used  Substance and Sexual Activity  . Alcohol use: Yes    Comment: occ  . Drug use: No  . Sexual activity: Not on file  Other Topics Concern  . Not on file    Social History Narrative  . Not on file   Social Determinants of Health   Financial Resource Strain:   . Difficulty of Paying Living Expenses: Not on file  Food Insecurity:   . Worried About Charity fundraiser in the Last Year: Not on file  . Ran Out of Food in the Last Year: Not on file  Transportation Needs:   . Lack of Transportation (Medical): Not on file  . Lack of Transportation (Non-Medical): Not on file  Physical Activity:   . Days of Exercise per Week: Not on file  . Minutes of Exercise per Session: Not on file  Stress:   . Feeling of Stress : Not on file  Social Connections:   . Frequency of Communication with Friends and Family: Not on file  . Frequency of Social Gatherings with Friends and Family: Not on file  . Attends Religious Services: Not on file  . Active Member of Clubs or Organizations: Not on file  . Attends Archivist Meetings: Not on file  . Marital Status: Not on file  Intimate Partner Violence:   . Fear of Current or Ex-Partner: Not on file  . Emotionally Abused: Not on file  . Physically Abused: Not on file  . Sexually Abused: Not on file   Family History  Problem Relation Age of Onset  . Heart disease Mother   . Diabetes Sister        Type II  . Glaucoma Maternal Grandmother   . Colon cancer Neg Hx   . Stomach cancer Neg Hx   . Esophageal cancer Neg Hx   . Rectal cancer Neg Hx      Review of Systems  All other systems reviewed and are negative.      Objective:   Physical Exam Vitals reviewed.  Constitutional:      General: He is not in acute distress.    Appearance: He is well-developed. He is not diaphoretic.  HENT:     Head: Normocephalic and atraumatic.     Right Ear: External ear normal.  Left Ear: External ear normal.     Nose: Nose normal.     Mouth/Throat:     Pharynx: No oropharyngeal exudate.  Eyes:     General: No scleral icterus.       Right eye: No discharge.        Left eye: No discharge.      Conjunctiva/sclera: Conjunctivae normal.     Pupils: Pupils are equal, round, and reactive to light.  Neck:     Thyroid: No thyromegaly.     Vascular: No JVD.     Trachea: No tracheal deviation.  Cardiovascular:     Rate and Rhythm: Normal rate and regular rhythm.     Heart sounds: Normal heart sounds. No murmur heard.  No friction rub. No gallop.   Pulmonary:     Effort: Pulmonary effort is normal. No respiratory distress.     Breath sounds: Normal breath sounds. No stridor. No wheezing or rales.  Chest:     Chest wall: No tenderness.  Abdominal:     General: Bowel sounds are normal. There is no distension.     Palpations: Abdomen is soft. There is no mass.     Tenderness: There is no abdominal tenderness. There is no guarding or rebound.  Genitourinary:    Penis: Normal.   Musculoskeletal:        General: No tenderness. Normal range of motion.     Cervical back: Normal range of motion and neck supple.  Lymphadenopathy:     Cervical: No cervical adenopathy.  Skin:    General: Skin is warm.     Coloration: Skin is not pale.     Findings: No erythema or rash.  Neurological:     Mental Status: He is alert and oriented to person, place, and time.     Cranial Nerves: No cranial nerve deficit.     Motor: No abnormal muscle tone.     Coordination: Coordination normal.     Deep Tendon Reflexes: Reflexes are normal and symmetric.  Psychiatric:        Behavior: Behavior normal.        Thought Content: Thought content normal.        Judgment: Judgment normal.           Assessment & Plan:  Routine general medical examination at a health care facility  Essential hypertension  Pure hypercholesterolemia  Physical exam today is completely normal.  Patient will start taking fish oil 2000 mg a day and try to eat a low saturated fat diet.  He is already exercising regularly.  I recommended discontinuation of smokeless tobacco.  Colonoscopy is up-to-date.  PSA is excellent.  The  remainder of his lab work is excellent.  Patient received his flu shot today.  Covid shot is up-to-date.  Regular anticipatory guidance is provided

## 2020-10-31 ENCOUNTER — Other Ambulatory Visit: Payer: Self-pay | Admitting: Family Medicine

## 2020-12-19 ENCOUNTER — Telehealth: Payer: Self-pay

## 2020-12-19 ENCOUNTER — Other Ambulatory Visit: Payer: Self-pay | Admitting: Family Medicine

## 2020-12-19 MED ORDER — POLYMYXIN B-TRIMETHOPRIM 10000-0.1 UNIT/ML-% OP SOLN: 2.0000 [drp] | OPHTHALMIC | 0 refills | Status: DC

## 2020-12-19 NOTE — Telephone Encounter (Signed)
Pt called stating went to urgent care on Saturday for eyes. states swollen, eye discharge causing dryness, dx with conjunctivitis but [facility] forgot to send Rx for him pt notes now spread to [facility] and he is requesting for you to send something as he spoke to [facility] yesterday and they still did not send rx for him.   Please advise

## 2020-12-19 NOTE — Telephone Encounter (Signed)
I sent in poly trim  

## 2021-08-29 ENCOUNTER — Other Ambulatory Visit: Payer: Self-pay

## 2021-08-29 ENCOUNTER — Other Ambulatory Visit: Payer: BC Managed Care – PPO

## 2021-08-29 DIAGNOSIS — I10 Essential (primary) hypertension: Secondary | ICD-10-CM

## 2021-08-29 DIAGNOSIS — Z125 Encounter for screening for malignant neoplasm of prostate: Secondary | ICD-10-CM

## 2021-08-29 DIAGNOSIS — Z1159 Encounter for screening for other viral diseases: Secondary | ICD-10-CM

## 2021-08-29 DIAGNOSIS — E7800 Pure hypercholesterolemia, unspecified: Secondary | ICD-10-CM

## 2021-08-29 DIAGNOSIS — Z136 Encounter for screening for cardiovascular disorders: Secondary | ICD-10-CM

## 2021-08-30 ENCOUNTER — Encounter: Payer: Self-pay | Admitting: Family Medicine

## 2021-08-30 ENCOUNTER — Ambulatory Visit (INDEPENDENT_AMBULATORY_CARE_PROVIDER_SITE_OTHER): Payer: BC Managed Care – PPO | Admitting: Family Medicine

## 2021-08-30 VITALS — BP 142/84 | HR 82 | Temp 98.4°F | Resp 16 | Ht 72.0 in | Wt 237.0 lb

## 2021-08-30 DIAGNOSIS — I10 Essential (primary) hypertension: Secondary | ICD-10-CM | POA: Diagnosis not present

## 2021-08-30 DIAGNOSIS — E7800 Pure hypercholesterolemia, unspecified: Secondary | ICD-10-CM | POA: Diagnosis not present

## 2021-08-30 DIAGNOSIS — Z23 Encounter for immunization: Secondary | ICD-10-CM | POA: Diagnosis not present

## 2021-08-30 DIAGNOSIS — Z0000 Encounter for general adult medical examination without abnormal findings: Secondary | ICD-10-CM | POA: Diagnosis not present

## 2021-08-30 LAB — COMPLETE METABOLIC PANEL WITH GFR
AG Ratio: 1.8 (calc) (ref 1.0–2.5)
ALT: 42 U/L (ref 9–46)
AST: 32 U/L (ref 10–35)
Albumin: 4.4 g/dL (ref 3.6–5.1)
Alkaline phosphatase (APISO): 49 U/L (ref 35–144)
BUN: 14 mg/dL (ref 7–25)
CO2: 26 mmol/L (ref 20–32)
Calcium: 9.6 mg/dL (ref 8.6–10.3)
Chloride: 100 mmol/L (ref 98–110)
Creat: 1.05 mg/dL (ref 0.70–1.30)
Globulin: 2.5 g/dL (calc) (ref 1.9–3.7)
Glucose, Bld: 104 mg/dL — ABNORMAL HIGH (ref 65–99)
Potassium: 4.5 mmol/L (ref 3.5–5.3)
Sodium: 139 mmol/L (ref 135–146)
Total Bilirubin: 0.3 mg/dL (ref 0.2–1.2)
Total Protein: 6.9 g/dL (ref 6.1–8.1)
eGFR: 84 mL/min/{1.73_m2} (ref >=60)

## 2021-08-30 LAB — CBC WITH DIFFERENTIAL/PLATELET
Absolute Monocytes: 902 cells/uL (ref 200–950)
Basophils Absolute: 19 cells/uL (ref 0–200)
Basophils Relative: 0.4 %
Eosinophils Absolute: 108 cells/uL (ref 15–500)
Eosinophils Relative: 2.3 %
HCT: 43.8 % (ref 38.5–50.0)
Hemoglobin: 15.2 g/dL (ref 13.2–17.1)
Lymphs Abs: 1603 cells/uL (ref 850–3900)
MCH: 32.5 pg (ref 27.0–33.0)
MCHC: 34.7 g/dL (ref 32.0–36.0)
MCV: 93.8 fL (ref 80.0–100.0)
MPV: 11 fL (ref 7.5–12.5)
Monocytes Relative: 19.2 %
Neutro Abs: 2068 cells/uL (ref 1500–7800)
Neutrophils Relative %: 44 %
Platelets: 152 10*3/uL (ref 140–400)
RBC: 4.67 10*6/uL (ref 4.20–5.80)
RDW: 12.6 % (ref 11.0–15.0)
Total Lymphocyte: 34.1 %
WBC: 4.7 10*3/uL (ref 3.8–10.8)

## 2021-08-30 LAB — LIPID PANEL
Cholesterol: 214 mg/dL — ABNORMAL HIGH (ref <200)
HDL: 41 mg/dL (ref >=40)
LDL Cholesterol (Calc): 143 mg/dL (calc) — ABNORMAL HIGH
Non-HDL Cholesterol (Calc): 173 mg/dL (calc) — ABNORMAL HIGH (ref <130)
Total CHOL/HDL Ratio: 5.2 (calc) — ABNORMAL HIGH (ref <5.0)
Triglycerides: 162 mg/dL — ABNORMAL HIGH (ref <150)

## 2021-08-30 LAB — HEPATITIS C ANTIBODY
Hepatitis C Ab: NONREACTIVE
SIGNAL TO CUT-OFF: 0.06 (ref <1.00)

## 2021-08-30 LAB — PSA: PSA: 0.4 ng/mL (ref <=4.00)

## 2021-08-30 MED ORDER — SILDENAFIL CITRATE 100 MG PO TABS: ORAL_TABLET | ORAL | 6 refills | Status: DC

## 2021-08-30 NOTE — Addendum Note (Signed)
Addended by: Sheral Flow on: 08/30/2021 05:39 PM   Modules accepted: Orders

## 2021-08-30 NOTE — Progress Notes (Signed)
Subjective:    Patient ID: Evan Strickland, male    DOB: 25-Feb-1966, 55 y.o.   MRN: [id number]  HPI Patient is a very pleasant 55 year old Caucasian gentleman who presents today for complete physical exam.  Most recent lab work is listed below Lab on 08/29/2021  Component Date Value Ref Range Status   WBC 08/29/2021 4.7  3.8 - 10.8 Thousand/uL Final   RBC 08/29/2021 4.67  4.20 - 5.80 Million/uL Final   Hemoglobin 08/29/2021 15.2  13.2 - 17.1 g/dL Final   HCT 08/29/2021 43.8  38.5 - 50.0 % Final   MCV 08/29/2021 93.8  80.0 - 100.0 fL Final   MCH 08/29/2021 32.5  27.0 - 33.0 pg Final   MCHC 08/29/2021 34.7  32.0 - 36.0 g/dL Final   RDW 08/29/2021 12.6  11.0 - 15.0 % Final   Platelets 08/29/2021 152  140 - 400 Thousand/uL Final   MPV 08/29/2021 11.0  7.5 - 12.5 fL Final   Neutro Abs 08/29/2021 2,068  1,500 - 7,800 cells/uL Final   Lymphs Abs 08/29/2021 1,603  850 - 3,900 cells/uL Final   Absolute Monocytes 08/29/2021 902  200 - 950 cells/uL Final   Eosinophils Absolute 08/29/2021 108  15 - 500 cells/uL Final   Basophils Absolute 08/29/2021 19  0 - 200 cells/uL Final   Neutrophils Relative % 08/29/2021 44  % Final   Total Lymphocyte 08/29/2021 34.1  % Final   Monocytes Relative 08/29/2021 19.2  % Final   Eosinophils Relative 08/29/2021 2.3  % Final   Basophils Relative 08/29/2021 0.4  % Final   Glucose, Bld 08/29/2021 104 (A)  65 - 99 mg/dL Final   Comment: .            Fasting reference interval . For someone without known diabetes, a glucose value between 100 and 125 mg/dL is consistent with prediabetes and should be confirmed with a follow-up test. .    BUN 08/29/2021 14  7 - 25 mg/dL Final   Creat 08/29/2021 1.05  0.70 - 1.30 mg/dL Final   eGFR 08/29/2021 84  > OR = 60 mL/min/1.81m2 Final   Comment: The eGFR is based on the CKD-EPI 2021 equation. To calculate  the new eGFR from a previous Creatinine or Cystatin C result, go to  https://www.kidney.org/professionals/ kdoqi/gfr%5Fcalculator    BUN/Creatinine Ratio 63/89/3734 NOT APPLICABLE  6 - 22 (calc) Final   Sodium 08/29/2021 139  135 - 146 mmol/L Final   Potassium 08/29/2021 4.5  3.5 - 5.3 mmol/L Final   Chloride 08/29/2021 100  98 - 110 mmol/L Final   CO2 08/29/2021 26  20 - 32 mmol/L Final   Calcium 08/29/2021 9.6  8.6 - 10.3 mg/dL Final   Total Protein 08/29/2021 6.9  6.1 - 8.1 g/dL Final   Albumin 08/29/2021 4.4  3.6 - 5.1 g/dL Final   Globulin 08/29/2021 2.5  1.9 - 3.7 g/dL (calc) Final   AG Ratio 08/29/2021 1.8  1.0 - 2.5 (calc) Final   Total Bilirubin 08/29/2021 0.3  0.2 - 1.2 mg/dL Final   Alkaline phosphatase (APISO) 08/29/2021 49  35 - 144 U/L Final   AST 08/29/2021 32  10 - 35 U/L Final   ALT 08/29/2021 42  9 - 46 U/L Final   PSA 08/29/2021 0.40  < OR = 4.00 ng/mL Final   Comment: The total PSA value from this assay system is  standardized against the WHO standard. The test  result will be approximately 20% lower when  compared  to the equimolar-standardized total PSA (Beckman  Coulter). Comparison of serial PSA results should be  interpreted with this fact in mind. . This test was performed using the Siemens  chemiluminescent method. Values obtained from  different assay methods cannot be used interchangeably. PSA levels, regardless of value, should not be interpreted as absolute evidence of the presence or absence of disease.    Cholesterol 08/29/2021 214 (A)  <200 mg/dL Final   HDL 08/29/2021 41  > OR = 40 mg/dL Final   Triglycerides 08/29/2021 162 (A)  <150 mg/dL Final   LDL Cholesterol (Calc) 08/29/2021 143 (A)  mg/dL (calc) Final   Comment: Reference range: <100 . Desirable range <100 mg/dL for primary prevention;   <70 mg/dL for patients with CHD or diabetic patients  with > or = 2 CHD risk factors. Marland Kitchen LDL-C is now calculated using the Martin-Hopkins  calculation, which is a validated novel method providing  better accuracy than  the Friedewald equation in the  estimation of LDL-C.  Cresenciano Genre et al. Annamaria Helling. 3329;518(84): 2061-2068  (http://education.[facility].com/faq/FAQ164)    Total CHOL/HDL Ratio 08/29/2021 5.2 (A)  <5.0 (calc) Final   Non-HDL Cholesterol (Calc) 08/29/2021 173 (A)  <130 mg/dL (calc) Final   Comment: For patients with diabetes plus 1 major ASCVD risk  factor, treating to a non-HDL-C goal of <100 mg/dL  (LDL-C of <70 mg/dL) is considered a therapeutic  option.    Hepatitis C Ab 08/29/2021 NON-REACTIVE  NON-REACTIVE Final   SIGNAL TO CUT-OFF 08/29/2021 0.06  <1.00 Final   Comment: . HCV antibody was non-reactive. There is no laboratory  evidence of HCV infection. . In most cases, no further action is required. However, if recent HCV exposure is suspected, a test for HCV RNA (test code [phone]) is suggested. . For additional information please refer to http://education.quest[facility].com/faq/FAQ22v1 (This link is being provided for informational/ educational purposes only.) .    Labs are excellent except for his cholesterol.  I calculate his 10-year risk of cardiovascular disease to be 9.2%.  Patient is not currently on a statin.  We had a long discussion about statin therapy and he declines a statin.  However we also discussed coronary artery calcium scoring and he is very interested in this.  This would help me determine whether he would benefit from a statin or whether it is not clinically necessary.  He would like to pursue this as long as its not too expensive.  His PSA is excellent.  His colonoscopy was significant for a tubular adenoma in 2018 and is due again in 2023.  Past Medical History:  Diagnosis Date   Hyperlipidemia    Hypertension    Past Surgical History:  Procedure Laterality Date   APPENDECTOMY  04/01/13   DECOMPRESSION FASCIOTOMY LEG Right    LAPAROSCOPIC APPENDECTOMY N/A 04/01/2013   Procedure: APPENDECTOMY LAPAROSCOPIC;  Surgeon: Rolm Bookbinder, MD;   Location: [facility];  Service: General;  Laterality: N/A;   SHOULDER SURGERY Bilateral    Current Outpatient Medications on File Prior to Visit  Medication Sig Dispense Refill   benazepril (LOTENSIN) 10 MG tablet TAKE 1 TABLET(10 MG) BY MOUTH DAILY 90 tablet 3   fish oil-omega-3 fatty acids 1000 MG capsule Take 1 g by mouth 2 (two) times daily.     sildenafil (VIAGRA) 100 MG tablet TAKE 1/2 TO 1 TABLET(50 TO 100 MG) BY MOUTH DAILY AS NEEDED FOR ERECTILE DYSFUNCTION 30 tablet 6   aspirin EC 81 MG tablet Take 81 mg  by mouth daily. (Patient not taking: Reported on 08/30/2021)     No current facility-administered medications on file prior to visit.   No Known Allergies Social History   Socioeconomic History   Marital status: Married    Spouse name: Not on file   Number of children: Not on file   Years of education: Not on file   Highest education level: Not on file  Occupational History   Not on file  Tobacco Use   Smoking status: Never   Smokeless tobacco: Never  Vaping Use   Vaping Use: Never used  Substance and Sexual Activity   Alcohol use: Yes    Comment: occ   Drug use: No   Sexual activity: Yes  Other Topics Concern   Not on file  Social History Narrative   Not on file   Social Determinants of Health   Financial Resource Strain: Not on file  Food Insecurity: Not on file  Transportation Needs: Not on file  Physical Activity: Not on file  Stress: Not on file  Social Connections: Not on file  Intimate Partner Violence: Not on file   Family History  Problem Relation Age of Onset   Heart disease Mother    Diabetes Sister        Type II   Glaucoma Maternal Grandmother    Colon cancer Neg Hx    Stomach cancer Neg Hx    Esophageal cancer Neg Hx    Rectal cancer Neg Hx      Review of Systems  All other systems reviewed and are negative.     Objective:   Physical Exam Vitals reviewed.  Constitutional:      General: He is not in acute distress.    Appearance:  He is well-developed. He is not diaphoretic.  HENT:     Head: Normocephalic and atraumatic.     Right Ear: External ear normal.     Left Ear: External ear normal.     Nose: Nose normal.     Mouth/Throat:     Pharynx: No oropharyngeal exudate.  Eyes:     General: No scleral icterus.       Right eye: No discharge.        Left eye: No discharge.     Conjunctiva/sclera: Conjunctivae normal.     Pupils: Pupils are equal, round, and reactive to light.  Neck:     Thyroid: No thyromegaly.     Vascular: No JVD.     Trachea: No tracheal deviation.  Cardiovascular:     Rate and Rhythm: Normal rate and regular rhythm.     Heart sounds: Normal heart sounds. No murmur heard.   No friction rub. No gallop.  Pulmonary:     Effort: Pulmonary effort is normal. No respiratory distress.     Breath sounds: Normal breath sounds. No stridor. No wheezing or rales.  Chest:     Chest wall: No tenderness.  Abdominal:     General: Bowel sounds are normal. There is no distension.     Palpations: Abdomen is soft. There is no mass.     Tenderness: There is no abdominal tenderness. There is no guarding or rebound.  Genitourinary:    Penis: Normal.   Musculoskeletal:        General: No tenderness. Normal range of motion.     Cervical back: Normal range of motion and neck supple.  Lymphadenopathy:     Cervical: No cervical adenopathy.  Skin:    General: Skin  is warm.     Coloration: Skin is not pale.     Findings: No erythema or rash.  Neurological:     Mental Status: He is alert and oriented to person, place, and time.     Cranial Nerves: No cranial nerve deficit.     Motor: No abnormal muscle tone.     Coordination: Coordination normal.     Deep Tendon Reflexes: Reflexes are normal and symmetric.  Psychiatric:        Behavior: Behavior normal.        Thought Content: Thought content normal.        Judgment: Judgment normal.          Assessment & Plan:  Need for immunization against  influenza - Plan: Flu Vaccine QUAD 23mo+IM (Fluarix, Fluzone & Alfiuria Quad PF)  Pure hypercholesterolemia - Plan: CT CARDIAC SCORING (SELF PAY ONLY)  Essential hypertension  Routine general medical examination at a health care facility Physical exam today is completely normal.  Regular anticipatory guidance is provided.  Patient would like to get the tetanus shot and a flu shot.  His left ear canal is completely obstructed with wax.  He wants [place] to remove that with irrigation.  I reviewed his labs with him.  His blood pressure slightly elevated today however he attributes this to anxiety and rushing to get here.  I would like to start the patient on a statin however he would like to get the coronary artery calcium score first.

## 2021-10-02 ENCOUNTER — Other Ambulatory Visit: Payer: Self-pay

## 2021-10-02 MED ORDER — SILDENAFIL CITRATE 100 MG PO TABS: ORAL_TABLET | ORAL | 6 refills | Status: DC

## 2021-10-03 ENCOUNTER — Other Ambulatory Visit: Payer: Self-pay

## 2021-10-03 MED ORDER — SILDENAFIL CITRATE 100 MG PO TABS: ORAL_TABLET | ORAL | 6 refills | Status: DC

## 2021-10-17 ENCOUNTER — Ambulatory Visit (HOSPITAL_BASED_OUTPATIENT_CLINIC_OR_DEPARTMENT_OTHER)
Admission: RE | Admit: 2021-10-17 | Discharge: 2021-10-17 | Disposition: A | Discharge status: Home / Self Care | Payer: Self-pay | Source: Ambulatory Visit | Attending: Family Medicine | Admitting: Family Medicine

## 2021-10-17 ENCOUNTER — Other Ambulatory Visit: Payer: Self-pay

## 2021-10-17 ENCOUNTER — Other Ambulatory Visit: Payer: Self-pay | Admitting: Family Medicine

## 2021-10-17 ENCOUNTER — Telehealth: Payer: Self-pay

## 2021-10-17 DIAGNOSIS — E7800 Pure hypercholesterolemia, unspecified: Secondary | ICD-10-CM | POA: Insufficient documentation

## 2021-10-17 NOTE — Telephone Encounter (Signed)
Dr. Ardeen Garland called from [facility] with incidental finding on pt's cardiac scoring CT.  Per Dr. Ardeen Garland pt has "multiple liver lesions, malignancy cannot be excluded". He recommends further evaluation with dedicated CT c/contrast or MRI.   FYI, please advise, thanks!

## 2021-10-18 ENCOUNTER — Other Ambulatory Visit: Payer: Self-pay | Admitting: Family Medicine

## 2021-10-18 DIAGNOSIS — K769 Liver disease, unspecified: Secondary | ICD-10-CM

## 2021-11-14 ENCOUNTER — Other Ambulatory Visit: Payer: Self-pay

## 2021-11-14 ENCOUNTER — Ambulatory Visit
Admission: RE | Admit: 2021-11-14 | Discharge: 2021-11-14 | Disposition: A | Discharge status: Home / Self Care | Payer: BC Managed Care – PPO | Source: Ambulatory Visit | Attending: Family Medicine | Admitting: Family Medicine

## 2021-11-14 ENCOUNTER — Encounter: Payer: Self-pay | Admitting: Family Medicine

## 2021-11-14 DIAGNOSIS — K769 Liver disease, unspecified: Secondary | ICD-10-CM

## 2021-11-14 MED ORDER — IOPAMIDOL (ISOVUE-300) INJECTION 61%
100.0000 mL | Freq: Once | INTRAVENOUS | Status: AC | PRN
  Administered 2021-11-14: 100 mL via INTRAVENOUS

## 2021-11-15 ENCOUNTER — Other Ambulatory Visit: Payer: Self-pay | Admitting: Family Medicine

## 2021-11-15 DIAGNOSIS — R160 Hepatomegaly, not elsewhere classified: Secondary | ICD-10-CM

## 2021-11-22 ENCOUNTER — Ambulatory Visit
Admission: RE | Admit: 2021-11-22 | Discharge: 2021-11-22 | Disposition: A | Discharge status: Home / Self Care | Payer: BC Managed Care – PPO | Source: Ambulatory Visit | Attending: Family Medicine | Admitting: Family Medicine

## 2021-11-22 DIAGNOSIS — R160 Hepatomegaly, not elsewhere classified: Secondary | ICD-10-CM

## 2021-11-22 MED ORDER — GADOBENATE DIMEGLUMINE 529 MG/ML IV SOLN
20.0000 mL | Freq: Once | INTRAVENOUS | Status: AC | PRN
  Administered 2021-11-22: 20 mL via INTRAVENOUS

## 2021-11-28 ENCOUNTER — Encounter: Payer: Self-pay | Admitting: Family Medicine

## 2021-11-29 ENCOUNTER — Other Ambulatory Visit: Payer: Self-pay | Admitting: Family Medicine

## 2021-11-29 DIAGNOSIS — R160 Hepatomegaly, not elsewhere classified: Secondary | ICD-10-CM

## 2022-02-21 ENCOUNTER — Other Ambulatory Visit: Payer: BC Managed Care – PPO

## 2022-03-06 ENCOUNTER — Telehealth: Payer: Self-pay

## 2022-03-06 NOTE — Telephone Encounter (Signed)
Portia with [facility] called in about pt's referral order. Imaging states that there are currently 2 orders placed for pt and they wanted to know if both orders are needed or if they are just duplicate orders.  Please advise. ? ?Cb#: Portia with [facility] at [phone] ?

## 2022-03-07 NOTE — Telephone Encounter (Signed)
Call spoke with [facility] and referral has been straight out. Voiced understanding.  ?

## 2022-03-08 ENCOUNTER — Ambulatory Visit
Admission: RE | Admit: 2022-03-08 | Discharge: 2022-03-08 | Disposition: A | Discharge status: Home / Self Care | Payer: BC Managed Care – PPO | Source: Ambulatory Visit | Attending: Family Medicine | Admitting: Family Medicine

## 2022-03-08 DIAGNOSIS — R160 Hepatomegaly, not elsewhere classified: Secondary | ICD-10-CM

## 2022-03-08 MED ORDER — GADOBENATE DIMEGLUMINE 529 MG/ML IV SOLN
20.0000 mL | Freq: Once | INTRAVENOUS | Status: AC | PRN
  Administered 2022-03-08: 20 mL via INTRAVENOUS

## 2022-09-10 ENCOUNTER — Telehealth: Payer: Self-pay | Admitting: Family Medicine

## 2022-09-10 NOTE — Telephone Encounter (Signed)
Left message to return call; need to reschedule lab appt (lab will be closed 09/12/22)

## 2022-09-11 ENCOUNTER — Other Ambulatory Visit: Payer: BC Managed Care – PPO

## 2022-09-11 DIAGNOSIS — E7800 Pure hypercholesterolemia, unspecified: Secondary | ICD-10-CM

## 2022-09-11 DIAGNOSIS — I10 Essential (primary) hypertension: Secondary | ICD-10-CM

## 2022-09-12 ENCOUNTER — Other Ambulatory Visit: Payer: BC Managed Care – PPO

## 2022-09-13 LAB — LIPID PANEL
Cholesterol: 229 mg/dL — ABNORMAL HIGH (ref <200)
HDL: 45 mg/dL (ref >=40)
LDL Cholesterol (Calc): 148 mg/dL (calc) — ABNORMAL HIGH
Non-HDL Cholesterol (Calc): 184 mg/dL (calc) — ABNORMAL HIGH (ref <130)
Total CHOL/HDL Ratio: 5.1 (calc) — ABNORMAL HIGH (ref <5.0)
Triglycerides: 217 mg/dL — ABNORMAL HIGH (ref <150)

## 2022-09-13 LAB — CBC WITH DIFFERENTIAL/PLATELET
Absolute Monocytes: 549 cells/uL (ref 200–950)
Basophils Absolute: 39 cells/uL (ref 0–200)
Basophils Relative: 0.7 %
Eosinophils Absolute: 90 cells/uL (ref 15–500)
Eosinophils Relative: 1.6 %
HCT: 44.1 % (ref 38.5–50.0)
Hemoglobin: 15.2 g/dL (ref 13.2–17.1)
Lymphs Abs: 2537 cells/uL (ref 850–3900)
MCH: 32.2 pg (ref 27.0–33.0)
MCHC: 34.5 g/dL (ref 32.0–36.0)
MCV: 93.4 fL (ref 80.0–100.0)
MPV: 10.9 fL (ref 7.5–12.5)
Monocytes Relative: 9.8 %
Neutro Abs: 2386 cells/uL (ref 1500–7800)
Neutrophils Relative %: 42.6 %
Platelets: 252 10*3/uL (ref 140–400)
RBC: 4.72 10*6/uL (ref 4.20–5.80)
RDW: 12.4 % (ref 11.0–15.0)
Total Lymphocyte: 45.3 %
WBC: 5.6 10*3/uL (ref 3.8–10.8)

## 2022-09-13 LAB — COMPLETE METABOLIC PANEL WITH GFR
AG Ratio: 1.7 (calc) (ref 1.0–2.5)
ALT: 39 U/L (ref 9–46)
AST: 29 U/L (ref 10–35)
Albumin: 4.5 g/dL (ref 3.6–5.1)
Alkaline phosphatase (APISO): 49 U/L (ref 35–144)
BUN: 16 mg/dL (ref 7–25)
CO2: 26 mmol/L (ref 20–32)
Calcium: 10 mg/dL (ref 8.6–10.3)
Chloride: 102 mmol/L (ref 98–110)
Creat: 1.04 mg/dL (ref 0.70–1.30)
Globulin: 2.7 g/dL (calc) (ref 1.9–3.7)
Glucose, Bld: 108 mg/dL — ABNORMAL HIGH (ref 65–99)
Potassium: 4.7 mmol/L (ref 3.5–5.3)
Sodium: 140 mmol/L (ref 135–146)
Total Bilirubin: 0.8 mg/dL (ref 0.2–1.2)
Total Protein: 7.2 g/dL (ref 6.1–8.1)
eGFR: 85 mL/min/{1.73_m2} (ref >=60)

## 2022-09-13 LAB — PSA, TOTAL AND FREE
PSA, % Free: 50 % (calc) (ref >25)
PSA, Free: 0.2 ng/mL
PSA, Total: 0.4 ng/mL (ref <=4.0)

## 2022-09-19 ENCOUNTER — Ambulatory Visit (INDEPENDENT_AMBULATORY_CARE_PROVIDER_SITE_OTHER): Payer: BC Managed Care – PPO | Admitting: Family Medicine

## 2022-09-19 ENCOUNTER — Encounter: Payer: Self-pay | Admitting: Family Medicine

## 2022-09-19 VITALS — BP 132/82 | HR 95 | Temp 98.4°F | Resp 20 | Ht 72.0 in | Wt 230.0 lb

## 2022-09-19 DIAGNOSIS — R160 Hepatomegaly, not elsewhere classified: Secondary | ICD-10-CM | POA: Diagnosis not present

## 2022-09-19 DIAGNOSIS — Z23 Encounter for immunization: Secondary | ICD-10-CM

## 2022-09-19 DIAGNOSIS — K635 Polyp of colon: Secondary | ICD-10-CM | POA: Diagnosis not present

## 2022-09-19 DIAGNOSIS — Z0000 Encounter for general adult medical examination without abnormal findings: Secondary | ICD-10-CM

## 2022-09-19 DIAGNOSIS — I10 Essential (primary) hypertension: Secondary | ICD-10-CM | POA: Diagnosis not present

## 2022-09-19 DIAGNOSIS — E7800 Pure hypercholesterolemia, unspecified: Secondary | ICD-10-CM

## 2022-09-19 NOTE — Progress Notes (Signed)
Subjective:    Patient ID: Evan Strickland, male    DOB: 11/04/1966, 56 y.o.   MRN: [id number]  HPI Patient is a very pleasant 56 year old Caucasian gentleman here today for a physical exam.  He has a history of hyperlipidemia on his lab work.  Last year we ordered a coronary artery calcium score.  His score was 2 in the LAD and 0 and all of his other coronary arteries ranking him in the 48th percentile.  As result he has not started a statin for his hyperlipidemia.  His blood pressure is excellent at 132/82.  His sugars remain elevated and he admits that his diet has been poor this year.  He has had his flu shot.  He is due for COVID shot and the shingles vaccine.  He is also overdue for colonoscopy.  His last colonoscopy was in 2018 and he had 3 polyps.  They recommended a repeat colonoscopy in 5 years. Lab on 09/11/2022  Component Date Value Ref Range Status   WBC 09/11/2022 5.6  3.8 - 10.8 Thousand/uL Final   RBC 09/11/2022 4.72  4.20 - 5.80 Million/uL Final   Hemoglobin 09/11/2022 15.2  13.2 - 17.1 g/dL Final   HCT 09/11/2022 44.1  38.5 - 50.0 % Final   MCV 09/11/2022 93.4  80.0 - 100.0 fL Final   MCH 09/11/2022 32.2  27.0 - 33.0 pg Final   MCHC 09/11/2022 34.5  32.0 - 36.0 g/dL Final   RDW 09/11/2022 12.4  11.0 - 15.0 % Final   Platelets 09/11/2022 252  140 - 400 Thousand/uL Final   MPV 09/11/2022 10.9  7.5 - 12.5 fL Final   Neutro Abs 09/11/2022 2,386  1,500 - 7,800 cells/uL Final   Lymphs Abs 09/11/2022 2,537  850 - 3,900 cells/uL Final   Absolute Monocytes 09/11/2022 549  200 - 950 cells/uL Final   Eosinophils Absolute 09/11/2022 90  15 - 500 cells/uL Final   Basophils Absolute 09/11/2022 39  0 - 200 cells/uL Final   Neutrophils Relative % 09/11/2022 42.6  % Final   Total Lymphocyte 09/11/2022 45.3  % Final   Monocytes Relative 09/11/2022 9.8  % Final   Eosinophils Relative 09/11/2022 1.6  % Final   Basophils Relative 09/11/2022 0.7  % Final   Cholesterol 09/11/2022 229 (H)   <200 mg/dL Final   HDL 09/11/2022 45  > OR = 40 mg/dL Final   Triglycerides 09/11/2022 217 (H)  <150 mg/dL Final   Comment: . If a non-fasting specimen was collected, consider repeat triglyceride testing on a fasting specimen if clinically indicated.  Yates Decamp et al. J. of Clin. Lipidol. 8832;5:498-264. Marland Kitchen    LDL Cholesterol (Calc) 09/11/2022 148 (H)  mg/dL (calc) Final   Comment: Reference range: <100 . Desirable range <100 mg/dL for primary prevention;   <70 mg/dL for patients with CHD or diabetic patients  with > or = 2 CHD risk factors. Marland Kitchen LDL-C is now calculated using the Martin-Hopkins  calculation, which is a validated novel method providing  better accuracy than the Friedewald equation in the  estimation of LDL-C.  Cresenciano Genre et al. Annamaria Helling. 1583;094(07): 2061-2068  (http://education.[facility].com/faq/FAQ164)    Total CHOL/HDL Ratio 09/11/2022 5.1 (H)  <5.0 (calc) Final   Non-HDL Cholesterol (Calc) 09/11/2022 184 (H)  <130 mg/dL (calc) Final   Comment: For patients with diabetes plus 1 major ASCVD risk  factor, treating to a non-HDL-C goal of <100 mg/dL  (LDL-C of <70 mg/dL) is considered a therapeutic  option.  Glucose, Bld 09/11/2022 108 (H)  65 - 99 mg/dL Final   Comment: .            Fasting reference interval . For someone without known diabetes, a glucose value between 100 and 125 mg/dL is consistent with prediabetes and should be confirmed with a follow-up test. .    BUN 09/11/2022 16  7 - 25 mg/dL Final   Creat 09/11/2022 1.04  0.70 - 1.30 mg/dL Final   eGFR 09/11/2022 85  > OR = 60 mL/min/1.69m Final   BUN/Creatinine Ratio 09/11/2022 SEE NOTE:  6 - 22 (calc) Final   Comment:    Not Reported: BUN and Creatinine are within    reference range. .    Sodium 09/11/2022 140  135 - 146 mmol/L Final   Potassium 09/11/2022 4.7  3.5 - 5.3 mmol/L Final   Chloride 09/11/2022 102  98 - 110 mmol/L Final   CO2 09/11/2022 26  20 - 32 mmol/L Final   Calcium  09/11/2022 10.0  8.6 - 10.3 mg/dL Final   Total Protein 09/11/2022 7.2  6.1 - 8.1 g/dL Final   Albumin 09/11/2022 4.5  3.6 - 5.1 g/dL Final   Globulin 09/11/2022 2.7  1.9 - 3.7 g/dL (calc) Final   AG Ratio 09/11/2022 1.7  1.0 - 2.5 (calc) Final   Total Bilirubin 09/11/2022 0.8  0.2 - 1.2 mg/dL Final   Alkaline phosphatase (APISO) 09/11/2022 49  35 - 144 U/L Final   AST 09/11/2022 29  10 - 35 U/L Final   ALT 09/11/2022 39  9 - 46 U/L Final   PSA, Total 09/11/2022 0.4  < OR = 4.0 ng/mL Final   PSA, Free 09/11/2022 0.2  ng/mL Final   PSA, % Free 09/11/2022 50  >25 % (calc) Final   Comment: . PSA(ng/mL)      Free PSA(%)     Estimated(x) Probability                                      of Cancer(as%) 0-2.5              (*)               Approx. 1 2.6-4.0(1)         0-27(2)                   24(3) 4.1-10(4)          0-10                      56                    11-15                     28                    16-20                     20                    21-25                     16                    >  or =26                   8 >10(+)             N/A                      >50 . References:(1)Catalona et al.:Urology 60: 469-474 (2002)            (2)Catalona et al.:J.Urol 168: 922-925 (2002)               Free PSA(%)   Sensitivity(%)  Specificity(%)               < or = 25          85              19               < or = 30          93               9            (3)Catalona et al.:JAMA 277: 1452-1455 (1997)            (4)Catalona et al.:JAMA 279: 9798-9211 (1998) . (x)These estimates vary with age, ethnicity, family     history and DRE results. (*)The                           diagnostic usefulness of % Free PSA has not been    established in patients with total PSA below 2.6 ng/mL (+)In men with PSA above 10 ng/mL, prostate cancer risk is    determined by total PSA alone. . The Total PSA value from this assay system is  standardized against the equimolar PSA standard.  The test  result will be approximately 20% higher  when compared to the [facility] Total PSA  (Siemens assay). Comparison of serial PSA results  should be interpreted with this fact in mind. Marland Kitchen PSA was performed using the Beckman Coulter Immunoassay method. Values obtained from different assay methods cannot be used interchangeably. PSA levels, regardless of value, should not be interpreted as absolute evidence of the presence or absence of disease. Marland Kitchen     Past Medical History:  Diagnosis Date   Hyperlipidemia    Hypertension    Past Surgical History:  Procedure Laterality Date   APPENDECTOMY  04/01/13   DECOMPRESSION FASCIOTOMY LEG Right    LAPAROSCOPIC APPENDECTOMY N/A 04/01/2013   Procedure: APPENDECTOMY LAPAROSCOPIC;  Surgeon: Rolm Bookbinder, MD;  Location: [place];  Service: General;  Laterality: N/A;   SHOULDER SURGERY Bilateral    Current Outpatient Medications on File Prior to Visit  Medication Sig Dispense Refill   benazepril (LOTENSIN) 10 MG tablet TAKE 1 TABLET(10 MG) BY MOUTH DAILY 90 tablet 3   fexofenadine (ALLEGRA) 180 MG tablet Take 180 mg by mouth daily. OTC OFF brand     fish oil-omega-3 fatty acids 1000 MG capsule Take 1 g by mouth 2 (two) times daily.     sildenafil (VIAGRA) 100 MG tablet TAKE 1/2 TO 1 TABLET(50 TO 100 MG) BY MOUTH DAILY AS NEEDED FOR ERECTILE DYSFUNCTION 30 tablet 6   No current facility-administered medications on file prior to visit.   No Known Allergies Social History   Socioeconomic History   Marital status: Married    Spouse name: Not on file  Number of children: Not on file   Years of education: Not on file   Highest education level: Not on file  Occupational History   Not on file  Tobacco Use   Smoking status: Never   Smokeless tobacco: Never  Vaping Use   Vaping Use: Never used  Substance and Sexual Activity   Alcohol use: Yes    Comment: occ   Drug use: No   Sexual activity: Yes  Other Topics Concern   Not on file   Social History Narrative   Not on file   Social Determinants of Health   Financial Resource Strain: Not on file  Food Insecurity: Not on file  Transportation Needs: Not on file  Physical Activity: Not on file  Stress: Not on file  Social Connections: Not on file  Intimate Partner Violence: Not on file   Family History  Problem Relation Age of Onset   Heart disease Mother    Diabetes Sister        Type II   Glaucoma Maternal Grandmother    Colon cancer Neg Hx    Stomach cancer Neg Hx    Esophageal cancer Neg Hx    Rectal cancer Neg Hx      Review of Systems  All other systems reviewed and are negative.      Objective:   Physical Exam Vitals reviewed.  Constitutional:      General: He is not in acute distress.    Appearance: He is well-developed. He is not diaphoretic.  HENT:     Head: Normocephalic and atraumatic.     Right Ear: External ear normal.     Left Ear: External ear normal.     Nose: Nose normal.     Mouth/Throat:     Pharynx: No oropharyngeal exudate.  Eyes:     General: No scleral icterus.       Right eye: No discharge.        Left eye: No discharge.     Conjunctiva/sclera: Conjunctivae normal.     Pupils: Pupils are equal, round, and reactive to light.  Neck:     Thyroid: No thyromegaly.     Vascular: No JVD.     Trachea: No tracheal deviation.  Cardiovascular:     Rate and Rhythm: Normal rate and regular rhythm.     Heart sounds: Normal heart sounds. No murmur heard.    No friction rub. No gallop.  Pulmonary:     Effort: Pulmonary effort is normal. No respiratory distress.     Breath sounds: Normal breath sounds. No stridor. No wheezing or rales.  Chest:     Chest wall: No tenderness.  Abdominal:     General: Bowel sounds are normal. There is no distension.     Palpations: Abdomen is soft. There is no mass.     Tenderness: There is no abdominal tenderness. There is no guarding or rebound.  Genitourinary:    Penis: Normal.    Musculoskeletal:        General: No tenderness. Normal range of motion.     Cervical back: Normal range of motion and neck supple.  Lymphadenopathy:     Cervical: No cervical adenopathy.  Skin:    General: Skin is warm.     Coloration: Skin is not pale.     Findings: No erythema or rash.  Neurological:     Mental Status: He is alert and oriented to person, place, and time.     Cranial Nerves: No  cranial nerve deficit.     Motor: No abnormal muscle tone.     Coordination: Coordination normal.     Deep Tendon Reflexes: Reflexes are normal and symmetric.  Psychiatric:        Behavior: Behavior normal.        Thought Content: Thought content normal.        Judgment: Judgment normal.           Assessment & Plan:  Need for immunization against influenza - Plan: Flu Vaccine QUAD 48moIM (Fluarix, Fluzone & Alfiuria Quad PF)  Polyp of colon, unspecified part of colon, unspecified type - Plan: Ambulatory referral to Gastroenterology  Liver masses  Essential hypertension  Routine general medical examination at a health care facility  Pure hypercholesterolemia We discussed vaccine.  We also discussed RSV.  The remainder of his immunizations are up-to-date.  I will schedule the patient for colonoscopy.  His PSA is outstanding.  I recommended a low-fat low carbohydrate diet to address his hyperlipidemia and also elevated blood sugar.  His blood pressure is outstanding.  He does have a history of fatty deposition in the liver seen on MRI.  We discussed a low-fat diet and exercise to help manage this.  We elected not to start statin given his low coronary artery calcium score

## 2022-10-17 ENCOUNTER — Other Ambulatory Visit: Payer: Self-pay | Admitting: Family Medicine

## 2022-10-17 NOTE — Telephone Encounter (Signed)
Requested Prescriptions  Pending Prescriptions Disp Refills   benazepril (LOTENSIN) 10 MG tablet [Pharmacy Med Name: BENAZEPRIL '10MG'$  TABLETS] 90 tablet 1    Sig: TAKE 1 TABLET(10 MG) BY MOUTH DAILY     Cardiovascular:  ACE Inhibitors Failed - 10/17/2022  1:57 PM      Failed - Valid encounter within last 6 months    Recent Outpatient Visits           1 year ago Routine general medical examination at a health care facility   [place], Cammie Mcgee, MD   2 years ago Routine general medical examination at a health care facility   [place], Cammie Mcgee, MD   3 years ago URI, acute   [place] Susy Frizzle, MD   3 years ago Routine general medical examination at a health care facility   [place], Cammie Mcgee, MD   4 years ago Viral conjunctivitis of right eye   [place] Delsa Grana, PA-C              Passed - Cr in normal range and within 180 days    Creat  Date Value Ref Range Status  09/11/2022 1.04 0.70 - 1.30 mg/dL Final         Passed - K in normal range and within 180 days    Potassium  Date Value Ref Range Status  09/11/2022 4.7 3.5 - 5.3 mmol/L Final         Passed - Patient is not pregnant      Passed - Last BP in normal range    BP Readings from Last 1 Encounters:  09/19/22 132/82

## 2022-10-18 ENCOUNTER — Encounter: Payer: Self-pay | Admitting: Gastroenterology

## 2022-11-01 ENCOUNTER — Ambulatory Visit (AMBULATORY_SURGERY_CENTER): Payer: BC Managed Care – PPO | Admitting: *Deleted

## 2022-11-01 VITALS — Ht 72.0 in | Wt 225.0 lb

## 2022-11-01 DIAGNOSIS — Z86000 Personal history of in-situ neoplasm of breast: Secondary | ICD-10-CM

## 2022-11-01 MED ORDER — NA SULFATE-K SULFATE-MG SULF 17.5-3.13-1.6 GM/177ML PO SOLN: 1.0000 | Freq: Once | ORAL | 0 refills | Status: AC

## 2022-11-01 NOTE — Progress Notes (Signed)
No egg or soy allergy known to patient  No issues known to pt with past sedation with any surgeries or procedures Patient denies ever being told they had issues or difficulty with intubation  No FH of Malignant Hyperthermia Pt is not on diet pills Pt is not on  home 02  Pt is not on blood thinners  Pt denies issues with constipation  Pt is not on dialysis Pt denies any upcoming cardiac testing Pt encouraged to use to use Singlecare or Goodrx to reduce cost  Patient's chart reviewed by Osvaldo Angst CNRA prior to previsit and patient appropriate for the [place].  Previsit completed and red dot placed by patient's name on their procedure day (on provider's schedule).  . Previsit via phone Instructions with coupon sent both mail and my chart

## 2022-12-02 ENCOUNTER — Encounter: Payer: Self-pay | Admitting: Gastroenterology

## 2022-12-04 ENCOUNTER — Ambulatory Visit (AMBULATORY_SURGERY_CENTER): Payer: BC Managed Care – PPO | Admitting: Gastroenterology

## 2022-12-04 ENCOUNTER — Encounter: Payer: Self-pay | Admitting: Gastroenterology

## 2022-12-04 VITALS — BP 103/72 | HR 71 | Temp 98.0°F | Resp 16 | Ht 72.0 in | Wt 225.0 lb

## 2022-12-04 DIAGNOSIS — Z09 Encounter for follow-up examination after completed treatment for conditions other than malignant neoplasm: Secondary | ICD-10-CM | POA: Diagnosis present

## 2022-12-04 DIAGNOSIS — D125 Benign neoplasm of sigmoid colon: Secondary | ICD-10-CM

## 2022-12-04 DIAGNOSIS — D122 Benign neoplasm of ascending colon: Secondary | ICD-10-CM | POA: Diagnosis not present

## 2022-12-04 DIAGNOSIS — D123 Benign neoplasm of transverse colon: Secondary | ICD-10-CM

## 2022-12-04 DIAGNOSIS — Z86010 Personal history of colonic polyps: Secondary | ICD-10-CM | POA: Diagnosis not present

## 2022-12-04 DIAGNOSIS — D120 Benign neoplasm of cecum: Secondary | ICD-10-CM

## 2022-12-04 MED ORDER — SODIUM CHLORIDE 0.9 % IV SOLN: 500.0000 mL | INTRAVENOUS | Status: DC

## 2022-12-04 NOTE — Progress Notes (Signed)
Called to room to assist during endoscopic procedure.  Patient ID and intended procedure confirmed with present staff. Received instructions for my participation in the procedure from the performing physician.

## 2022-12-04 NOTE — Patient Instructions (Signed)
Await pathology results.  Handout on polyps, diverticulosis, and hemorrhoids provided.  YOU HAD AN ENDOSCOPIC PROCEDURE TODAY AT [facility]:   Refer to the procedure report that was given to you for any specific questions about what was found during the examination.  If the procedure report does not answer your questions, please call your gastroenterologist to clarify.  If you requested that your care partner not be given the details of your procedure findings, then the procedure report has been included in a sealed envelope for you to review at your convenience later.  YOU SHOULD EXPECT: Some feelings of bloating in the abdomen. Passage of more gas than usual.  Walking can help get rid of the air that was put into your GI tract during the procedure and reduce the bloating. If you had a lower endoscopy (such as a colonoscopy or flexible sigmoidoscopy) you may notice spotting of blood in your stool or on the toilet paper. If you underwent a bowel prep for your procedure, you may not have a normal bowel movement for a few days.  Please Note:  You might notice some irritation and congestion in your nose or some drainage.  This is from the oxygen used during your procedure.  There is no need for concern and it should clear up in a day or so.  SYMPTOMS TO REPORT IMMEDIATELY:  Following lower endoscopy (colonoscopy or flexible sigmoidoscopy):  Excessive amounts of blood in the stool  Significant tenderness or worsening of abdominal pains  Swelling of the abdomen that is new, acute  Fever of 100F or higher  For urgent or emergent issues, a gastroenterologist can be reached at any hour by calling [phone]. Do not use MyChart messaging for urgent concerns.    DIET:  We do recommend a small meal at first, but then you may proceed to your regular diet.  Drink plenty of fluids but you should avoid alcoholic beverages for 24 hours.  ACTIVITY:  You should plan to take it easy for  the rest of today and you should NOT DRIVE or use heavy machinery until tomorrow (because of the sedation medicines used during the test).    FOLLOW UP: Our staff will call the number listed on your records the next business day following your procedure.  We will call around 7:15- 8:00 am to check on you and address any questions or concerns that you may have regarding the information given to you following your procedure. If we do not reach you, we will leave a message.     If any biopsies were taken you will be contacted by phone or by letter within the next 1-3 weeks.  Please call us at [phone] if you have not heard about the biopsies in 3 weeks.    SIGNATURES/CONFIDENTIALITY: You and/or your care partner have signed paperwork which will be entered into your electronic medical record.  These signatures attest to the fact that that the information above on your After Visit Summary has been reviewed and is understood.  Full responsibility of the confidentiality of this discharge information lies with you and/or your care-partner.

## 2022-12-04 NOTE — Progress Notes (Signed)
Pt's states no medical or surgical changes since previsit or office visit. Vital signs obtained by DT, NT.

## 2022-12-04 NOTE — Progress Notes (Signed)
[facility] History and Physical   Primary Care Physician:  Susy Frizzle, MD   Reason for Procedure:   History of colon polyps  Plan:    colonoscopy     HPI: Evan Strickland is a 57 y.o. male  here for colonoscopy surveillance - 3 polyps removed 10/2017 -TA, [facility].   Patient denies any bowel symptoms at this time. No family history of colon cancer known. Otherwise feels well without any cardiopulmonary symptoms.   I have discussed risks / benefits of anesthesia and endoscopic procedure with Carita Pian and they wish to proceed with the exams as outlined today.    Past Medical History:  Diagnosis Date   Hyperlipidemia    Hypertension     Past Surgical History:  Procedure Laterality Date   APPENDECTOMY  04/01/13   DECOMPRESSION FASCIOTOMY LEG Right    LAPAROSCOPIC APPENDECTOMY N/A 04/01/2013   Procedure: APPENDECTOMY LAPAROSCOPIC;  Surgeon: Rolm Bookbinder, MD;  Location: [place];  Service: General;  Laterality: N/A;   SHOULDER SURGERY Bilateral     Prior to Admission medications   Medication Sig Start Date End Date Taking? Authorizing Provider  benazepril (LOTENSIN) 10 MG tablet TAKE 1 TABLET(10 MG) BY MOUTH DAILY 10/17/22  Yes Susy Frizzle, MD  fexofenadine (ALLEGRA) 180 MG tablet Take 180 mg by mouth daily. OTC OFF brand   Yes [provider]  fish oil-omega-3 fatty acids 1000 MG capsule Take 1 g by mouth 2 (two) times daily.   Yes [provider]  Vitamin D, Ergocalciferol, (DRISDOL) 1.25 MG (50000 UNIT) CAPS capsule Take 50,000 Units by mouth every 7 (seven) days.   Yes [provider]  sildenafil (VIAGRA) 100 MG tablet TAKE 1/2 TO 1 TABLET(50 TO 100 MG) BY MOUTH DAILY AS NEEDED FOR ERECTILE DYSFUNCTION 10/03/21   Susy Frizzle, MD    Current Outpatient Medications  Medication Sig Dispense Refill   benazepril (LOTENSIN) 10 MG tablet TAKE 1 TABLET(10 MG) BY MOUTH DAILY 90 tablet 1   fexofenadine (ALLEGRA) 180 MG tablet  Take 180 mg by mouth daily. OTC OFF brand     fish oil-omega-3 fatty acids 1000 MG capsule Take 1 g by mouth 2 (two) times daily.     Vitamin D, Ergocalciferol, (DRISDOL) 1.25 MG (50000 UNIT) CAPS capsule Take 50,000 Units by mouth every 7 (seven) days.     sildenafil (VIAGRA) 100 MG tablet TAKE 1/2 TO 1 TABLET(50 TO 100 MG) BY MOUTH DAILY AS NEEDED FOR ERECTILE DYSFUNCTION 30 tablet 6   Current Facility-Administered Medications  Medication Dose Route Frequency Provider Last Rate Last Admin   0.9 %  sodium chloride infusion  500 mL Intravenous Continuous Deantre Bourdon, Carlota Raspberry, MD        Allergies as of 12/04/2022   (No Known Allergies)    Family History  Problem Relation Age of Onset   Heart disease Mother    Diabetes Sister        Type II   Glaucoma Maternal Grandmother    Colon cancer Neg Hx    Stomach cancer Neg Hx    Esophageal cancer Neg Hx    Rectal cancer Neg Hx    Colon polyps Neg Hx     Social History   Socioeconomic History   Marital status: Married    Spouse name: Not on file   Number of children: Not on file   Years of education: Not on file   Highest education level: Not on file  Occupational  History   Not on file  Tobacco Use   Smoking status: Never   Smokeless tobacco: Never  Vaping Use   Vaping Use: Never used  Substance and Sexual Activity   Alcohol use: Yes    Comment: occ   Drug use: No   Sexual activity: Yes  Other Topics Concern   Not on file  Social History Narrative   Not on file   Social Determinants of Health   Financial Resource Strain: Not on file  Food Insecurity: Not on file  Transportation Needs: Not on file  Physical Activity: Not on file  Stress: Not on file  Social Connections: Not on file  Intimate Partner Violence: Not on file    Review of Systems: All other review of systems negative except as mentioned in the HPI.  Physical Exam: Vital signs BP 127/72   Pulse 87   Temp 98 F (36.7 C) (Skin)   Ht 6' (1.829 m)    Wt 225 lb (102.1 kg)   SpO2 97%   BMI 30.52 kg/m   General:   Alert,  Well-developed, pleasant and cooperative in NAD Lungs:  Clear throughout to auscultation.   Heart:  Regular rate and rhythm Abdomen:  Soft, nontender and nondistended.   Neuro/Psych:  Alert and cooperative. Normal mood and affect. A and O x 3  Jolly Mango, MD [facility]

## 2022-12-04 NOTE — Op Note (Signed)
[place] Patient Name: Evan Strickland Procedure Date: 12/04/2022 2:44 PM MRN: [id number] Endoscopist: Remo Lipps P. Havery Moros , MD, [id number] Age: 57 Referring MD:  Date of Birth: 05/24/1966 Gender: Male Account #: [id number] Procedure:                Colonoscopy Indications:              High risk colon cancer surveillance: Personal                            history of colonic polyps - adenoma / sessile                            serrated polyp removed 10/2017 Medicines:                Monitored Anesthesia Care Procedure:                Pre-Anesthesia Assessment:                           - Prior to the procedure, a History and Physical                            was performed, and patient medications and                            allergies were reviewed. The patient's tolerance of                            previous anesthesia was also reviewed. The risks                            and benefits of the procedure and the sedation                            options and risks were discussed with the patient.                            All questions were answered, and informed consent                            was obtained. Prior Anticoagulants: The patient has                            taken no anticoagulant or antiplatelet agents. ASA                            Grade Assessment: II - A patient with mild systemic                            disease. After reviewing the risks and benefits,                            the patient was deemed in satisfactory condition to  undergo the procedure.                           After obtaining informed consent, the colonoscope                            was passed under direct vision. Throughout the                            procedure, the patient's blood pressure, pulse, and                            oxygen saturations were monitored continuously. The                            CF [id number] #[id number] was  introduced through the anus                            and advanced to the the cecum, identified by                            appendiceal orifice and ileocecal valve. The                            colonoscopy was performed without difficulty. The                            patient tolerated the procedure well. The quality                            of the bowel preparation was good. The ileocecal                            valve, appendiceal orifice, and rectum were                            photographed. Scope In: 2:51:38 PM Scope Out: 3:12:39 PM Scope Withdrawal Time: 0 hours 17 minutes 23 seconds  Total Procedure Duration: 0 hours 21 minutes 1 second  Findings:                 The perianal and digital rectal examinations were                            normal.                           Three flat and sessile polyps were found in the                            cecum. The polyps were 2 to 5 mm in size. These                            polyps were removed with a cold snare. Resection  and retrieval were complete.                           A 5 mm polyp was found in the ascending colon. The                            polyp was flat. The polyp was removed with a cold                            snare. Resection and retrieval were complete.                           Three flat and sessile polyps were found in the                            transverse colon. The polyps were 3 to 4 mm in                            size. These polyps were removed with a cold snare.                            Resection and retrieval were complete.                           Two sessile polyps were found in the sigmoid colon.                            The polyps were 3 to 4 mm in size. These polyps                            were removed with a cold snare. Resection and                            retrieval were complete.                           Scattered small-mouthed diverticula  were found in                            the transverse colon and left colon.                           Internal hemorrhoids were found during retroflexion.                           The exam was otherwise without abnormality. Complications:            No immediate complications. Estimated blood loss:                            Minimal. Estimated Blood Loss:     Estimated blood loss was minimal. Impression:               - Three 2 to 5 mm polyps in  the cecum, removed with                            a cold snare. Resected and retrieved.                           - One 5 mm polyp in the ascending colon, removed                            with a cold snare. Resected and retrieved.                           - Three 3 to 4 mm polyps in the transverse colon,                            removed with a cold snare. Resected and retrieved.                           - Two 3 to 4 mm polyps in the sigmoid colon,                            removed with a cold snare. Resected and retrieved.                           - Diverticulosis in the transverse colon and in the                            left colon.                           - Internal hemorrhoids.                           - The examination was otherwise normal. Recommendation:           - Patient has a contact number available for                            emergencies. The signs and symptoms of potential                            delayed complications were discussed with the                            patient. Return to normal activities tomorrow.                            Written discharge instructions were provided to the                            patient.                           - Resume previous diet.                           -  Continue present medications.                           - Await pathology results. Remo Lipps P. Starr Urias, MD 12/04/2022 3:19:50 PM This report has been signed electronically.

## 2022-12-05 ENCOUNTER — Telehealth: Payer: Self-pay

## 2022-12-05 NOTE — Telephone Encounter (Signed)
Left message on follow up call. 

## 2023-02-22 ENCOUNTER — Other Ambulatory Visit: Payer: Self-pay | Admitting: Family Medicine

## 2023-02-24 NOTE — Telephone Encounter (Signed)
LOV 09/19/22 for CPE.  Requested Prescriptions  Pending Prescriptions Disp Refills   sildenafil (VIAGRA) 100 MG tablet [Pharmacy Med Name: Sildenafil Citrate 100 MG Oral Tablet] 90 tablet 0    Sig: TAKE 1/2 TO 1 (ONE-HALF TO ONE) TABLET BY MOUTH ONCE DAILY AS NEEDED FOR ERECTILE DYSFUNCTION     Urology: Erectile Dysfunction Agents Failed - 02/22/2023  3:20 PM      Failed - Valid encounter within last 12 months    Recent Outpatient Visits           1 year ago Routine general medical examination at a health care facility   [facility] Pickard, Priscille Heidelberg, MD   2 years ago Routine general medical examination at a health care facility   [facility] Pickard, Priscille Heidelberg, MD   3 years ago URI, acute   [facility] Donita Brooks, MD   4 years ago Routine general medical examination at a health care facility   [facility] Pickard, Priscille Heidelberg, MD   4 years ago Viral conjunctivitis of right eye   [facility] Danelle Berry, PA-C              Passed - AST in normal range and within 360 days    AST  Date Value Ref Range Status  09/11/2022 29 10 - 35 U/L Final         Passed - ALT in normal range and within 360 days    ALT  Date Value Ref Range Status  09/11/2022 39 9 - 46 U/L Final         Passed - Last BP in normal range    BP Readings from Last 1 Encounters:  12/04/22 103/72

## 2023-04-28 ENCOUNTER — Other Ambulatory Visit: Payer: Self-pay | Admitting: Family Medicine

## 2023-10-13 IMAGING — CT CT CARDIAC CORONARY ARTERY CALCIUM SCORE
3 series · 14 of 20 positions shown, 16 images · non-contrast
Comparison: Abdominal CT 04/01/2013
COMPARISON: Abdominal CT 04/01/2013

Addendum:
EXAM:
OVER-READ INTERPRETATION  CT CHEST

The following report is an over-read performed by radiologist Dr.
Aidok Boran [REDACTED] on 10/17/2021. This over-read
does not include interpretation of cardiac or coronary anatomy or
pathology. The coronary calcium score interpretation by the
cardiologist is attached.
CLINICAL DATA: Cardiovascular Disease Risk stratification
Coronary Calcium Score
TECHNIQUE: A gated, non-contrast computed tomography scan of the heart was
performed using 3mm slice thickness. Axial images were analyzed on a
dedicated workstation. Calcium scoring of the coronary arteries was
performed using the Agatston method.

[Series 2: ax lung · axial · 0.94mm/px · z∈[+1123,+1235]mm · 5 of 86 slices shown]
[im 15/86  lung]
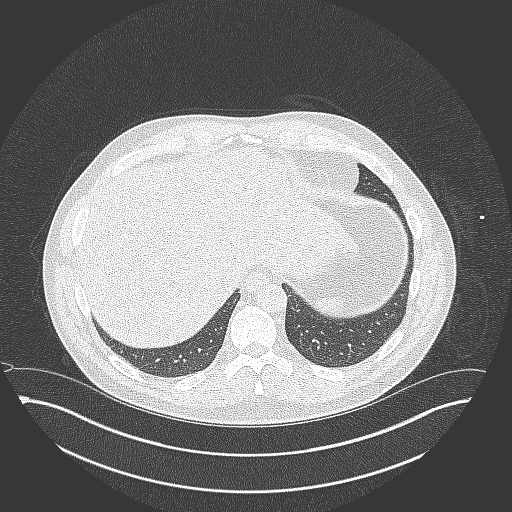
[im 29/86  lung]
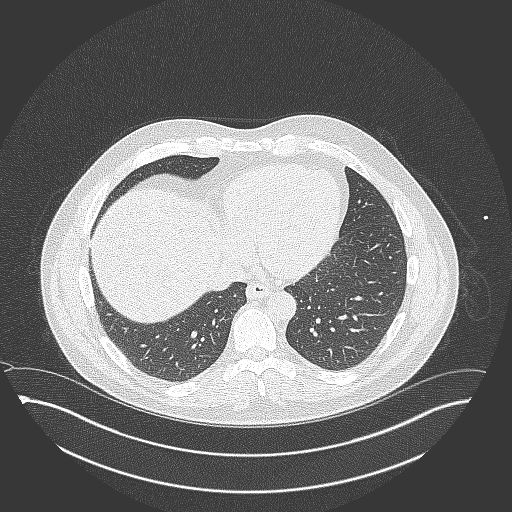
[im 43/86  lung]
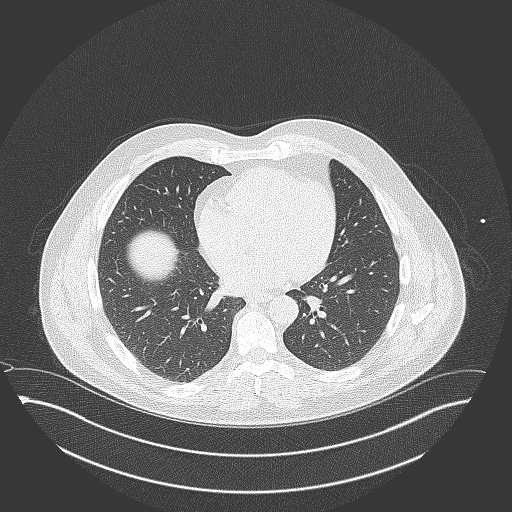
[im 57/86  lung]
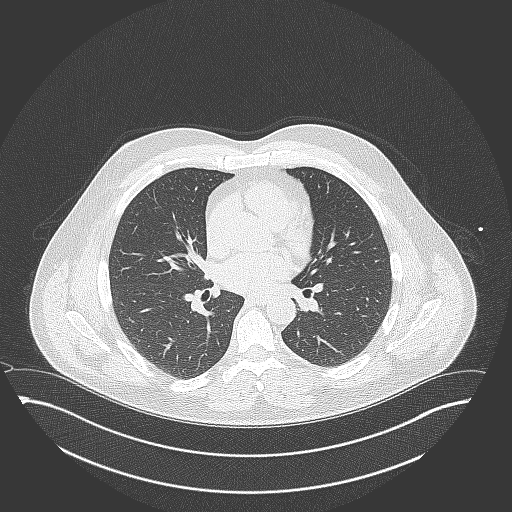
[im 71/86  lung]
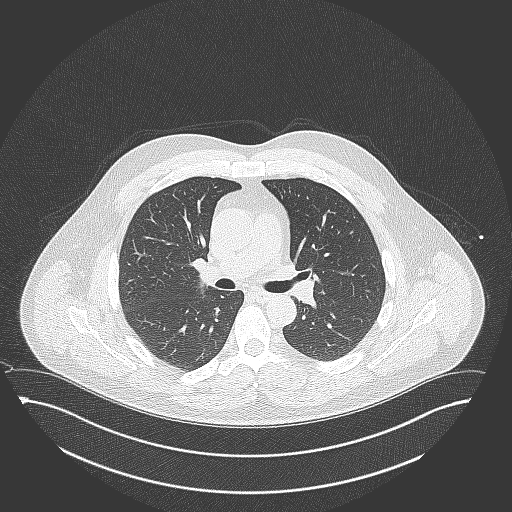

[Series 3: cascseq 3.0 sa36 70% (id) · axial · 0.39mm/px · z∈[+1139,+1223]mm · 3 of 57 slices shown]
[im 15/57  vessel]
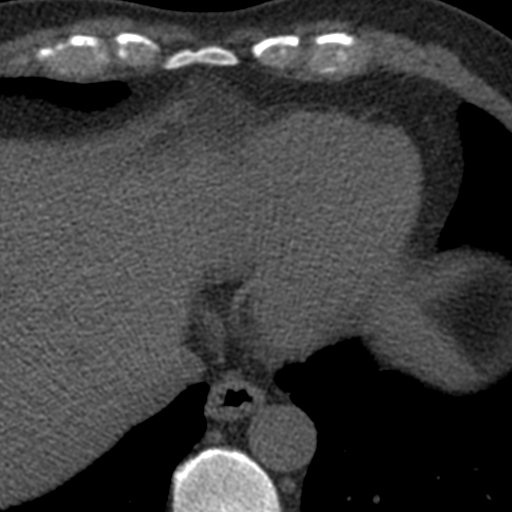
[im 29/57  vessel]
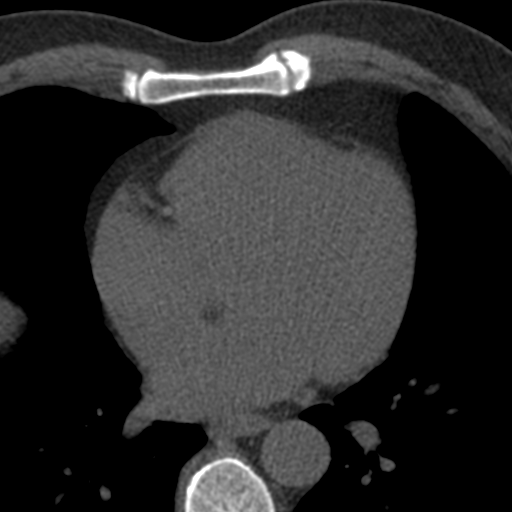
[im 43/57  vessel]
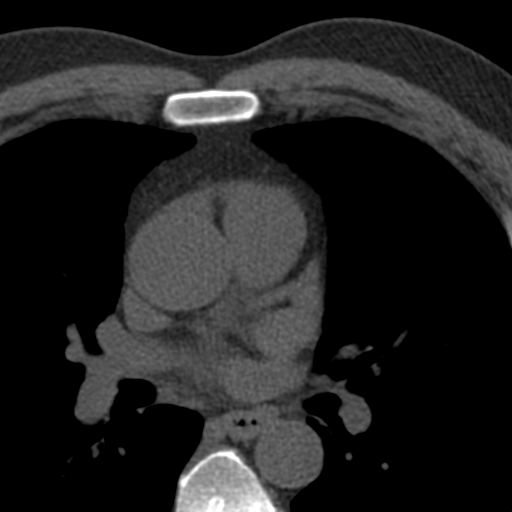

[Series 4: ax st · axial · 0.94mm/px · z∈[+1119,+1239]mm · 6 of 86 slices shown, 8 images]
[im 13/86  vessel]
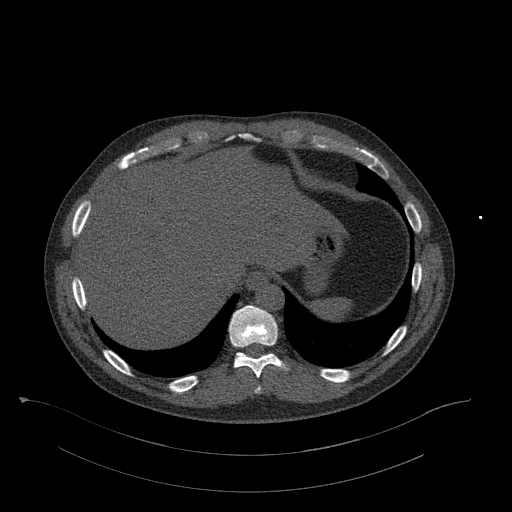
[im 13/86  lung]
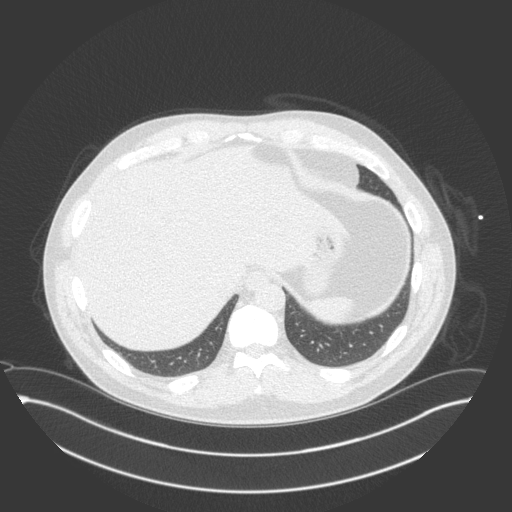
[im 25/86  vessel]
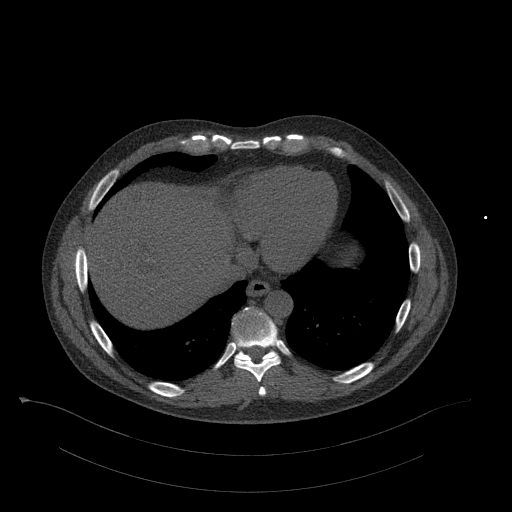
[im 37/86  vessel]
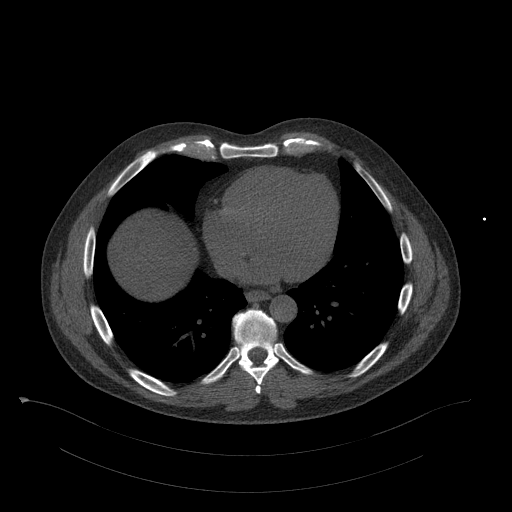
[im 49/86  vessel]
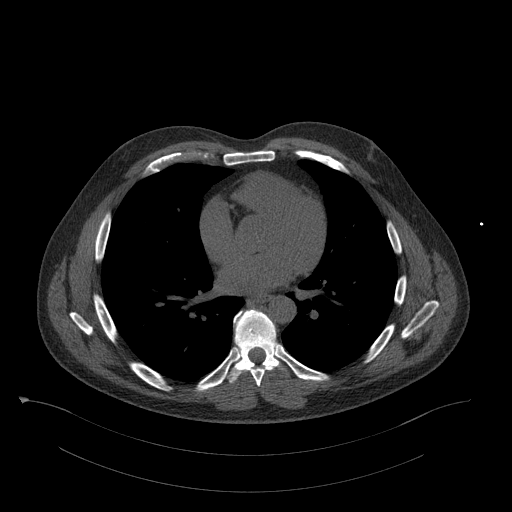
[im 61/86  vessel]
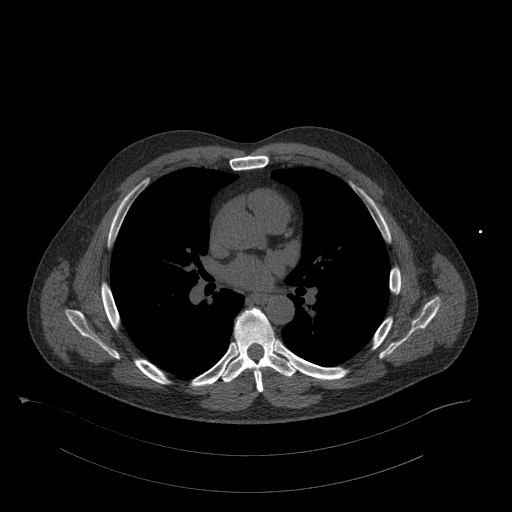
[im 61/86  lung]
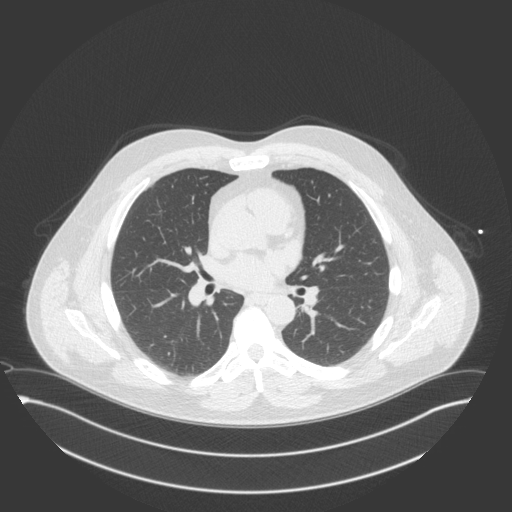
[im 73/86  vessel]
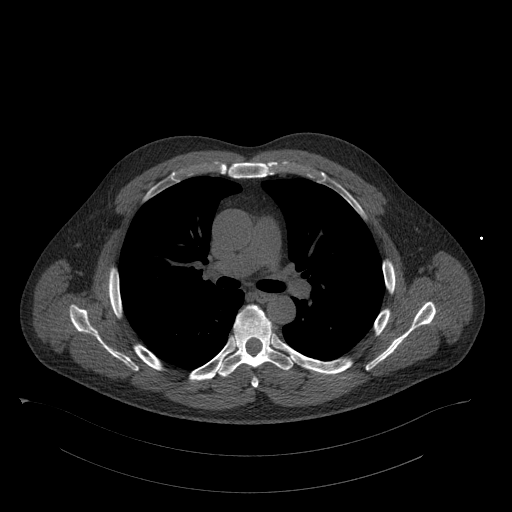

[14 of 20 positions shown; findings below may reference images not displayed]

FINDINGS: Vascular: Heart is normal size.  Aorta normal caliber.

Mediastinum/Nodes: No adenopathy

Lungs/Pleura: No confluent airspace opacities or effusions.

Upper Abdomen: Ill-defined low-density lesions throughout the liver,
not present on prior abdominal CT. These are subtle and difficult to
visualized or characterized on this noncontrast study.

Musculoskeletal: Chest wall soft tissues are unremarkable. No acute
bony abnormality.
IMPRESSION: Subtle ill-defined low-density lesions throughout the liver, new
since 9764. Cannot exclude metastatic disease. Recommend clinical
correlation for any cancer history and further evaluation with
abdominal CT with IV contrast.

These results were called by telephone at the time of interpretation
on 10/17/2021 at [DATE] to nurse Dali Bonjour, who verbally
acknowledged these results.
FINDINGS: Coronary arteries: Normal origins.

Coronary Calcium Score:

Left main: 0

Left anterior descending artery: 2

Left circumflex artery: 0

Right coronary artery: 0

Total: 2

Percentile: 48

Pericardium: Normal.

Aorta: Borderline normal caliber of ascending aorta (estimated at 37
mm). No aortic atherosclerosis noted.

Non-cardiac: See separate report from [REDACTED].
IMPRESSION: Coronary calcium score of 2. This was 48th percentile for age-,
race-, and sex-matched controls. Please see comments from [REDACTED] re: Liver findings.



If CAC=0, it is reasonable to withhold statin therapy and reassess
in 5 to 10 years, as long as higher risk conditions are absent
(diabetes mellitus, family history of premature CHD in first degree
relatives (males <55 years; females <65 years), cigarette smoking,
or LDL >=190 mg/dL).

If CAC is 1 to 99, it is reasonable to initiate statin therapy for
patients >=55 years of age.

If CAC is >=100 or >=75th percentile, it is reasonable to initiate
statin therapy at any age.

Cardiology referral should be considered for patients with CAC
scores >=400 or >=75th percentile.

*4073 AHA/ACC/AACVPR/AAPA/ABC/LUUMBARDH/MARIA VAN/JIM/Ulzen/NAZARETH/LEAL GONZALEZ/REI DAVI
Guideline on the Management of Blood Cholesterol: A Report of the
American College of Cardiology/American Heart Association Task Force
on Clinical Practice Guidelines. J Am Coll Cardiol.
1007;73(24):5967-5718.

*** End of Addendum ***
EXAM:
OVER-READ INTERPRETATION  CT CHEST

The following report is an over-read performed by radiologist Dr.
Aidok Boran [REDACTED] on 10/17/2021. This over-read
does not include interpretation of cardiac or coronary anatomy or
pathology. The coronary calcium score interpretation by the
cardiologist is attached.
FINDINGS: Vascular: Heart is normal size.  Aorta normal caliber.

Mediastinum/Nodes: No adenopathy

Lungs/Pleura: No confluent airspace opacities or effusions.

Upper Abdomen: Ill-defined low-density lesions throughout the liver,
not present on prior abdominal CT. These are subtle and difficult to
visualized or characterized on this noncontrast study.

Musculoskeletal: Chest wall soft tissues are unremarkable. No acute
bony abnormality.
IMPRESSION: Subtle ill-defined low-density lesions throughout the liver, new
since 9764. Cannot exclude metastatic disease. Recommend clinical
correlation for any cancer history and further evaluation with
abdominal CT with IV contrast.

These results were called by telephone at the time of interpretation
on 10/17/2021 at [DATE] to nurse Dali Bonjour, who verbally
acknowledged these results.

## 2023-10-21 ENCOUNTER — Other Ambulatory Visit: Payer: Self-pay | Admitting: Family Medicine

## 2023-10-24 ENCOUNTER — Other Ambulatory Visit: Payer: Self-pay | Admitting: Family Medicine

## 2023-10-24 ENCOUNTER — Telehealth: Payer: Self-pay

## 2023-10-24 ENCOUNTER — Other Ambulatory Visit: Payer: Self-pay

## 2023-10-24 DIAGNOSIS — I10 Essential (primary) hypertension: Secondary | ICD-10-CM

## 2023-10-24 MED ORDER — BENAZEPRIL HCL 10 MG PO TABS: 10.0000 mg | ORAL_TABLET | Freq: Every day | ORAL | 0 refills | Status: DC

## 2023-10-24 NOTE — Telephone Encounter (Signed)
Copied from CRM [id number]. Topic: Clinical - Medication Refill >> Oct 24, 2023 11:10 AM Evan Strickland wrote: Most Recent Primary Care Visit:  Provider: Lynnea Ferrier T  Department[facility]  Visit Type: PHYSICAL  Date: 09/19/2022  Medication: benazepril (LOTENSIN) 10 MG tablet  Has the patient contacted their pharmacy?  (Agent: If no, request that the patient contact the pharmacy for the refill. If patient does not wish to contact the pharmacy document the reason why and proceed with request.) (Agent: If yes, when and what did the pharmacy advise?)  Is this the correct pharmacy for this prescription?  If no, delete pharmacy and type the correct one.  This is the patient's preferred pharmacy:  [id number] G[place] Otto, [place] - [address] AT [facility] OF [address] & [facility] [address] [place] [address] Phone: [phone] Fax: [phone]  [facility] [address] [place] - [address] [place] [address] Phone: [phone] Fax: [phone]   Has the prescription been filled recently?   Is the patient out of the medication?   Has the patient been seen for an appointment in the last year OR does the patient have an upcoming appointment?   Can we respond through MyChart?   Agent: Please be advised that Rx refills may take up to 3 business days. We ask that you follow-up with your pharmacy.

## 2023-10-24 NOTE — Telephone Encounter (Signed)
Copied from CRM [id number]. Topic: Clinical - Request for Lab/Test Order >> Oct 24, 2023  3:35 PM Fonda Kinder J wrote: Reason for CRM: Pt is requesting a lab order so that he can his labs done prior to his 01/02 appt. Pt is requesting a callback

## 2023-11-14 ENCOUNTER — Encounter: Payer: BC Managed Care – PPO | Admitting: Family Medicine

## 2023-11-24 ENCOUNTER — Other Ambulatory Visit: Payer: Self-pay | Admitting: Family Medicine

## 2023-11-24 DIAGNOSIS — I10 Essential (primary) hypertension: Secondary | ICD-10-CM

## 2023-11-25 NOTE — Telephone Encounter (Signed)
 Requested medication (s) are due for refill today:   Yes  Requested medication (s) are on the active medication list:   Yes  Future visit scheduled:   Yes 12/11/2023 with Dr. Duanne.    Had appt on 11/14/2023 but pt cancelled.    LOV 09/19/2022.   Last ordered: 10/24/2023 #30, 0 refills as a courtesy    Unable to refill because labs and an OV are due per protocol.    Requested Prescriptions  Pending Prescriptions Disp Refills   benazepril  (LOTENSIN ) 10 MG tablet [Pharmacy Med Name: BENAZEPRIL  10MG  TABLETS] 30 tablet 0    Sig: TAKE 1 TABLET(10 MG) BY MOUTH DAILY     Cardiovascular:  ACE Inhibitors Failed - 11/25/2023  2:57 PM      Failed - Cr in normal range and within 180 days    Creat  Date Value Ref Range Status  09/11/2022 1.04 0.70 - 1.30 mg/dL Final         Failed - K in normal range and within 180 days    Potassium  Date Value Ref Range Status  09/11/2022 4.7 3.5 - 5.3 mmol/L Final         Failed - Valid encounter within last 6 months    Recent Outpatient Visits           2 years ago Routine general medical examination at a health care facility   [facility] Duanne Butler DASEN, MD   3 years ago Routine general medical examination at a health care facility   [facility] Pickard, Butler DASEN, MD   4 years ago URI, acute   [facility] Duanne, Butler DASEN, MD   5 years ago Routine general medical examination at a health care facility   [facility] Pickard, Butler DASEN, MD   5 years ago Viral conjunctivitis of right eye   [facility] Leavy Mole, PA-C              Passed - Patient is not pregnant      Passed - Last BP in normal range    BP Readings from Last 1 Encounters:  12/04/22 103/72

## 2023-11-28 ENCOUNTER — Other Ambulatory Visit: Payer: Self-pay | Admitting: Family Medicine

## 2023-11-28 DIAGNOSIS — I10 Essential (primary) hypertension: Secondary | ICD-10-CM

## 2023-11-28 MED ORDER — BENAZEPRIL HCL 10 MG PO TABS: 10.0000 mg | ORAL_TABLET | Freq: Every day | ORAL | 0 refills | Status: DC

## 2023-11-28 NOTE — Telephone Encounter (Signed)
Rx refused  - already sent in refill.

## 2023-11-28 NOTE — Telephone Encounter (Signed)
Requested Prescriptions  Refused Prescriptions Disp Refills   benazepril (LOTENSIN) 10 MG tablet [Pharmacy Med Name: BENAZEPRIL 10MG  TABLETS] 90 tablet     Sig: TAKE 1 TABLET(10 MG) BY MOUTH DAILY     Cardiovascular:  ACE Inhibitors Failed - 11/28/2023  3:35 PM      Failed - Cr in normal range and within 180 days    Creat  Date Value Ref Range Status  09/11/2022 1.04 0.70 - 1.30 mg/dL Final         Failed - K in normal range and within 180 days    Potassium  Date Value Ref Range Status  09/11/2022 4.7 3.5 - 5.3 mmol/L Final         Failed - Valid encounter within last 6 months    Recent Outpatient Visits           2 years ago Routine general medical examination at a health care facility   [facility] Donita Brooks, MD   3 years ago Routine general medical examination at a health care facility   [facility] Pickard, Priscille Heidelberg, MD   4 years ago URI, acute   [facility] Tanya Nones, Priscille Heidelberg, MD   5 years ago Routine general medical examination at a health care facility   [facility] Medicine Pickard, Priscille Heidelberg, MD   5 years ago Viral conjunctivitis of right eye   [facility] Danelle Berry, PA-C              Passed - Patient is not pregnant      Passed - Last BP in normal range    BP Readings from Last 1 Encounters:  12/04/22 103/72

## 2023-11-28 NOTE — Telephone Encounter (Signed)
Copied from CRM [id number]. Topic: Clinical - Medication Refill >> Nov 28, 2023 10:37 AM Antony Haste wrote: Most Recent Primary Care Visit:  Provider: Lynnea Ferrier T  Department: [facility]  Visit Type: PHYSICAL  Date: 09/19/2022  Medication: benazepril (LOTENSIN) 10 MG tablet  Has the patient contacted their pharmacy? Yes (Agent: If no, request that the patient contact the pharmacy for the refill. If patient does not wish to contact the pharmacy document the reason why and proceed with request.) (Agent: If yes, when and what did the pharmacy advise?)  Is this the correct pharmacy for this prescription? Yes If no, delete pharmacy and type the correct one.  This is the patient's preferred pharmacy:  [facility] #[id number] G[place] Otto, [place] - [address] AT [facility] OF [address] & [facility] [address] [place] [address] Phone: [phone] Fax: [phone]   Has the prescription been filled recently? No  Is the patient out of the medication? Yes, he is requesting to have enough until his physical appt with PCP on 01/30.  Has the patient been seen for an appointment in the last year OR does the patient have an upcoming appointment? Yes  Can we respond through MyChart? No  Agent: Please be advised that Rx refills may take up to 3 business days. We ask that you follow-up with your pharmacy.

## 2023-12-09 ENCOUNTER — Other Ambulatory Visit: Payer: Self-pay

## 2023-12-09 DIAGNOSIS — E7800 Pure hypercholesterolemia, unspecified: Secondary | ICD-10-CM

## 2023-12-09 DIAGNOSIS — R160 Hepatomegaly, not elsewhere classified: Secondary | ICD-10-CM

## 2023-12-09 DIAGNOSIS — Z125 Encounter for screening for malignant neoplasm of prostate: Secondary | ICD-10-CM

## 2023-12-09 DIAGNOSIS — I10 Essential (primary) hypertension: Secondary | ICD-10-CM

## 2023-12-10 LAB — COMPLETE METABOLIC PANEL WITH GFR
AG Ratio: 2 (calc) (ref 1.0–2.5)
ALT: 34 U/L (ref 9–46)
AST: 19 U/L (ref 10–35)
Albumin: 4.6 g/dL (ref 3.6–5.1)
Alkaline phosphatase (APISO): 49 U/L (ref 35–144)
BUN: 15 mg/dL (ref 7–25)
CO2: 25 mmol/L (ref 20–32)
Calcium: 9.8 mg/dL (ref 8.6–10.3)
Chloride: 102 mmol/L (ref 98–110)
Creat: 0.95 mg/dL (ref 0.70–1.30)
Globulin: 2.3 g/dL (ref 1.9–3.7)
Glucose, Bld: 110 mg/dL — ABNORMAL HIGH (ref 65–99)
Potassium: 4.5 mmol/L (ref 3.5–5.3)
Sodium: 139 mmol/L (ref 135–146)
Total Bilirubin: 0.5 mg/dL (ref 0.2–1.2)
Total Protein: 6.9 g/dL (ref 6.1–8.1)
eGFR: 94 mL/min/{1.73_m2} (ref >=60)

## 2023-12-10 LAB — CBC WITH DIFFERENTIAL/PLATELET
Absolute Lymphocytes: 2030 {cells}/uL (ref 850–3900)
Absolute Monocytes: 541 {cells}/uL (ref 200–950)
Basophils Absolute: 42 {cells}/uL (ref 0–200)
Basophils Relative: 0.8 %
Eosinophils Absolute: 170 {cells}/uL (ref 15–500)
Eosinophils Relative: 3.2 %
HCT: 44.6 % (ref 38.5–50.0)
Hemoglobin: 14.9 g/dL (ref 13.2–17.1)
MCH: 32 pg (ref 27.0–33.0)
MCHC: 33.4 g/dL (ref 32.0–36.0)
MCV: 95.7 fL (ref 80.0–100.0)
MPV: 11 fL (ref 7.5–12.5)
Monocytes Relative: 10.2 %
Neutro Abs: 2518 {cells}/uL (ref 1500–7800)
Neutrophils Relative %: 47.5 %
Platelets: 173 10*3/uL (ref 140–400)
RBC: 4.66 10*6/uL (ref 4.20–5.80)
RDW: 12.4 % (ref 11.0–15.0)
Total Lymphocyte: 38.3 %
WBC: 5.3 10*3/uL (ref 3.8–10.8)

## 2023-12-10 LAB — LIPID PANEL
Cholesterol: 234 mg/dL — ABNORMAL HIGH (ref <200)
HDL: 50 mg/dL (ref >=40)
LDL Cholesterol (Calc): 157 mg/dL — ABNORMAL HIGH
Non-HDL Cholesterol (Calc): 184 mg/dL — ABNORMAL HIGH (ref <130)
Total CHOL/HDL Ratio: 4.7 (calc) (ref <5.0)
Triglycerides: 147 mg/dL (ref <150)

## 2023-12-10 LAB — PSA: PSA: 0.51 ng/mL (ref <=4.00)

## 2023-12-11 ENCOUNTER — Ambulatory Visit (INDEPENDENT_AMBULATORY_CARE_PROVIDER_SITE_OTHER): Payer: 59 | Admitting: Family Medicine

## 2023-12-11 ENCOUNTER — Encounter: Payer: Self-pay | Admitting: Family Medicine

## 2023-12-11 VITALS — BP 132/78 | HR 65 | Temp 98.2°F | Ht 72.0 in | Wt 240.8 lb

## 2023-12-11 DIAGNOSIS — Z0000 Encounter for general adult medical examination without abnormal findings: Secondary | ICD-10-CM

## 2023-12-11 DIAGNOSIS — Z23 Encounter for immunization: Secondary | ICD-10-CM | POA: Diagnosis not present

## 2023-12-11 DIAGNOSIS — Z0001 Encounter for general adult medical examination with abnormal findings: Secondary | ICD-10-CM

## 2023-12-11 DIAGNOSIS — I10 Essential (primary) hypertension: Secondary | ICD-10-CM

## 2023-12-11 DIAGNOSIS — E7800 Pure hypercholesterolemia, unspecified: Secondary | ICD-10-CM

## 2023-12-11 NOTE — Progress Notes (Signed)
Subjective:    Patient ID: Evan Strickland, male    DOB: [birth year], 58 y.o.   MRN: [id number]  HPI Patient is a very pleasant 58 year old Caucasian gentleman here today for a physical exam.  He has a history of hyperlipidemia on his lab work.  2022, we ordered a coronary artery calcium score.  His score was 2 in the LAD and 0 and all of his other coronary arteries ranking him in the 48th percentile.  Last colonoscopy was 2024, due to several polyps, they recommended a repeat colonoscopy in 2027.  Patient declines a flu shot today.  He would like to get the shingles vaccine.  His blood sugar is steadily rising.  His cholesterol remains elevated.  He admits that his diet has been poor over the last year or so.  He has gained more than 20 pounds.  He has been unable to exercise due to hip pain. Lab on 12/09/2023  Component Date Value Ref Range Status   WBC 12/09/2023 5.3  3.8 - 10.8 Thousand/uL Final   RBC 12/09/2023 4.66  4.20 - 5.80 Million/uL Final   Hemoglobin 12/09/2023 14.9  13.2 - 17.1 g/dL Final   HCT 52/84/1324 44.6  38.5 - 50.0 % Final   MCV 12/09/2023 95.7  80.0 - 100.0 fL Final   MCH 12/09/2023 32.0  27.0 - 33.0 pg Final   MCHC 12/09/2023 33.4  32.0 - 36.0 g/dL Final   Comment: For adults, a slight decrease in the calculated MCHC value (in the range of 30 to 32 g/dL) is most likely not clinically significant; however, it should be interpreted with caution in correlation with other red cell parameters and the patient's clinical condition.    RDW 12/09/2023 12.4  11.0 - 15.0 % Final   Platelets 12/09/2023 173  140 - 400 Thousand/uL Final   MPV 12/09/2023 11.0  7.5 - 12.5 fL Final   Neutro Abs 12/09/2023 2,518  1,500 - 7,800 cells/uL Final   Absolute Lymphocytes 12/09/2023 2,030  850 - 3,900 cells/uL Final   Absolute Monocytes 12/09/2023 541  200 - 950 cells/uL Final   Eosinophils Absolute 12/09/2023 170  15 - 500 cells/uL Final   Basophils Absolute 12/09/2023 42  0 - 200  cells/uL Final   Neutrophils Relative % 12/09/2023 47.5  % Final   Total Lymphocyte 12/09/2023 38.3  % Final   Monocytes Relative 12/09/2023 10.2  % Final   Eosinophils Relative 12/09/2023 3.2  % Final   Basophils Relative 12/09/2023 0.8  % Final   Glucose, Bld 12/09/2023 110 (H)  65 - 99 mg/dL Final   Comment: .            Fasting reference interval . For someone without known diabetes, a glucose value between 100 and 125 mg/dL is consistent with prediabetes and should be confirmed with a follow-up test. .    BUN 12/09/2023 15  7 - 25 mg/dL Final   Creat 40/08/2724 0.95  0.70 - 1.30 mg/dL Final   eGFR 36/64/4034 94  > OR = 60 mL/min/1.80m2 Final   BUN/Creatinine Ratio 12/09/2023 SEE NOTE:  6 - 22 (calc) Final   Comment:    Not Reported: BUN and Creatinine are within    reference range. .    Sodium 12/09/2023 139  135 - 146 mmol/L Final   Potassium 12/09/2023 4.5  3.5 - 5.3 mmol/L Final   Chloride 12/09/2023 102  98 - 110 mmol/L Final   CO2 12/09/2023 25  20 -  32 mmol/L Final   Calcium 12/09/2023 9.8  8.6 - 10.3 mg/dL Final   Total Protein 60/45/4098 6.9  6.1 - 8.1 g/dL Final   Albumin 11/91/4782 4.6  3.6 - 5.1 g/dL Final   Globulin 95/62/1308 2.3  1.9 - 3.7 g/dL (calc) Final   AG Ratio 12/09/2023 2.0  1.0 - 2.5 (calc) Final   Total Bilirubin 12/09/2023 0.5  0.2 - 1.2 mg/dL Final   Alkaline phosphatase (APISO) 12/09/2023 49  35 - 144 U/L Final   AST 12/09/2023 19  10 - 35 U/L Final   ALT 12/09/2023 34  9 - 46 U/L Final   Cholesterol 12/09/2023 234 (H)  <200 mg/dL Final   HDL 65/78/4696 50  > OR = 40 mg/dL Final   Triglycerides 29/52/8413 147  <150 mg/dL Final   LDL Cholesterol (Calc) 12/09/2023 157 (H)  mg/dL (calc) Final   Comment: Reference range: <100 . Desirable range <100 mg/dL for primary prevention;   <70 mg/dL for patients with CHD or diabetic patients  with > or = 2 CHD risk factors. Marland Kitchen LDL-C is now calculated using the Martin-Hopkins  calculation, which is a  validated novel method providing  better accuracy than the Friedewald equation in the  estimation of LDL-C.  Horald Pollen et al. Lenox Ahr. 2440;102(72): 2061-2068  (http://education.[facility].com/faq/FAQ164)    Total CHOL/HDL Ratio 12/09/2023 4.7  <5.3 (calc) Final   Non-HDL Cholesterol (Calc) 12/09/2023 184 (H)  <130 mg/dL (calc) Final   Comment: For patients with diabetes plus 1 major ASCVD risk  factor, treating to a non-HDL-C goal of <100 mg/dL  (LDL-C of <66 mg/dL) is considered a therapeutic  option.    PSA 12/09/2023 0.51  < OR = 4.00 ng/mL Final   Comment: The total PSA value from this assay system is  standardized against the WHO standard. The test  result will be approximately 20% lower when compared  to the equimolar-standardized total PSA (Beckman  Coulter). Comparison of serial PSA results should be  interpreted with this fact in mind. . This test was performed using the Siemens  chemiluminescent method. Values obtained from  different assay methods cannot be used interchangeably. PSA levels, regardless of value, should not be interpreted as absolute evidence of the presence or absence of disease.     Past Medical History:  Diagnosis Date   Hyperlipidemia    Hypertension    Past Surgical History:  Procedure Laterality Date   APPENDECTOMY  04/01/13   DECOMPRESSION FASCIOTOMY LEG Right    LAPAROSCOPIC APPENDECTOMY N/A 04/01/2013   Procedure: APPENDECTOMY LAPAROSCOPIC;  Surgeon: Emelia Loron, MD;  Location: [facility] OR;  Service: General;  Laterality: N/A;   SHOULDER SURGERY Bilateral    Current Outpatient Medications on File Prior to Visit  Medication Sig Dispense Refill   benazepril (LOTENSIN) 10 MG tablet Take 1 tablet (10 mg total) by mouth daily. 30 tablet 0   fexofenadine (ALLEGRA) 180 MG tablet Take 180 mg by mouth daily. OTC OFF brand     fish oil-omega-3 fatty acids 1000 MG capsule Take 1 g by mouth 2 (two) times daily.     sildenafil (VIAGRA) 100 MG  tablet TAKE 1/2 TO 1 (ONE-HALF TO ONE) TABLET BY MOUTH ONCE DAILY AS NEEDED FOR ERECTILE DYSFUNCTION 90 tablet 0   Vitamin D, Ergocalciferol, (DRISDOL) 1.25 MG (50000 UNIT) CAPS capsule Take 50,000 Units by mouth every 7 (seven) days.     No current facility-administered medications on file prior to visit.   No Known Allergies  Social History   Socioeconomic History   Marital status: Married    Spouse name: Not on file   Number of children: Not on file   Years of education: Not on file   Highest education level: Not on file  Occupational History   Not on file  Tobacco Use   Smoking status: Never   Smokeless tobacco: Never  Vaping Use   Vaping status: Never Used  Substance and Sexual Activity   Alcohol use: Yes    Comment: occ   Drug use: No   Sexual activity: Yes  Other Topics Concern   Not on file  Social History Narrative   Not on file   Social Drivers of Health   Financial Resource Strain: Not on file  Food Insecurity: Not on file  Transportation Needs: Not on file  Physical Activity: Not on file  Stress: Not on file  Social Connections: Not on file  Intimate Partner Violence: Not on file   Family History  Problem Relation Age of Onset   Heart disease Mother    Diabetes Sister        Type II   Glaucoma Maternal Grandmother    Colon cancer Neg Hx    Stomach cancer Neg Hx    Esophageal cancer Neg Hx    Rectal cancer Neg Hx    Colon polyps Neg Hx      Review of Systems  All other systems reviewed and are negative.      Objective:   Physical Exam Vitals reviewed.  Constitutional:      General: He is not in acute distress.    Appearance: He is well-developed. He is not diaphoretic.  HENT:     Head: Normocephalic and atraumatic.     Right Ear: External ear normal.     Left Ear: External ear normal.     Nose: Nose normal.     Mouth/Throat:     Pharynx: No oropharyngeal exudate.  Eyes:     General: No scleral icterus.       Right eye: No discharge.         Left eye: No discharge.     Conjunctiva/sclera: Conjunctivae normal.     Pupils: Pupils are equal, round, and reactive to light.  Neck:     Thyroid: No thyromegaly.     Vascular: No JVD.     Trachea: No tracheal deviation.  Cardiovascular:     Rate and Rhythm: Normal rate and regular rhythm.     Heart sounds: Normal heart sounds. No murmur heard.    No friction rub. No gallop.  Pulmonary:     Effort: Pulmonary effort is normal. No respiratory distress.     Breath sounds: Normal breath sounds. No stridor. No wheezing or rales.  Chest:     Chest wall: No tenderness.  Abdominal:     General: Bowel sounds are normal. There is no distension.     Palpations: Abdomen is soft. There is no mass.     Tenderness: There is no abdominal tenderness. There is no guarding or rebound.  Genitourinary:    Penis: Normal.   Musculoskeletal:        General: No tenderness. Normal range of motion.     Cervical back: Normal range of motion and neck supple.  Lymphadenopathy:     Cervical: No cervical adenopathy.  Skin:    General: Skin is warm.     Coloration: Skin is not pale.     Findings: No erythema  or rash.  Neurological:     Mental Status: He is alert and oriented to person, place, and time.     Cranial Nerves: No cranial nerve deficit.     Motor: No abnormal muscle tone.     Coordination: Coordination normal.     Deep Tendon Reflexes: Reflexes are normal and symmetric.  Psychiatric:        Behavior: Behavior normal.        Thought Content: Thought content normal.        Judgment: Judgment normal.           Assessment & Plan:  Routine general medical examination at a health care facility  Pure hypercholesterolemia  Essential hypertension Blood pressure today looks excellent.  Patient declines a flu shot.  He read the first of Shingrix.  Colonoscopy due again in 2027.  PSA is excellent.  I recommended a low carbohydrate diet to address his elevated blood sugar.  I am  concerned about his cholesterol despite his good coronary artery calcium score.  I recommended trying to work on his diet and increase his aerobic exercise as tolerated.  Also recommended adding fish oil 2000 mg a day

## 2023-12-11 NOTE — Addendum Note (Signed)
Addended by: Venia Carbon K on: 12/11/2023 12:26 PM   Modules accepted: Orders

## 2023-12-15 ENCOUNTER — Telehealth (INDEPENDENT_AMBULATORY_CARE_PROVIDER_SITE_OTHER): Payer: 59 | Admitting: Family Medicine

## 2023-12-15 ENCOUNTER — Encounter: Payer: Self-pay | Admitting: Family Medicine

## 2023-12-15 ENCOUNTER — Ambulatory Visit: Payer: Self-pay | Admitting: Family Medicine

## 2023-12-15 DIAGNOSIS — J018 Other acute sinusitis: Secondary | ICD-10-CM

## 2023-12-15 DIAGNOSIS — J208 Acute bronchitis due to other specified organisms: Secondary | ICD-10-CM | POA: Insufficient documentation

## 2023-12-15 MED ORDER — FLUTICASONE PROPIONATE 50 MCG/ACT NA SUSP: 2.0000 | Freq: Every day | NASAL | 0 refills | Status: AC

## 2023-12-15 MED ORDER — AMOXICILLIN 875 MG PO TABS: 875.0000 mg | ORAL_TABLET | Freq: Two times a day (BID) | ORAL | 0 refills | Status: AC

## 2023-12-15 NOTE — Progress Notes (Signed)
Virtual Visit via Video Note   This visit type was conducted per patient request This format is felt to be appropriate for this patient at this time.  All issues noted in this document were discussed and addressed.  A limited physical exam was performed with this format.  A verbal consent was obtained for the virtual visit.   Date:  12/15/2023   ID:  Evan Strickland, DOB 21-Dec-1965, MRN [id number]  Patient Location: Home Provider Location: Home Office  PCP:  Donita Brooks, MD   Chief Complaint:  cough and congestion  Discussed the use of AI scribe software for clinical note transcription with the patient, who gave verbal consent to proceed.  History of Present Illness         The patient, with a history of hypertension and hyperlipidemia, presents with acute respiratory symptoms. He describes a sensation of pressure and stuffiness, suggestive of sinus congestion. He also reports a persistent cough, which has been particularly bothersome at night. He has tried over-the-counter remedies for the congestion, which provided temporary relief but the symptoms returned. He denies any body aches or fever. He has no history of asthma or COPD and is a non-smoker. He tested for covid 19 and was negative. His symptoms started last Friday.   Past Medical History:  Diagnosis Date   Hyperlipidemia    Hypertension     Past Surgical History:  Procedure Laterality Date   APPENDECTOMY  04/01/13   DECOMPRESSION FASCIOTOMY LEG Right    LAPAROSCOPIC APPENDECTOMY N/A 04/01/2013   Procedure: APPENDECTOMY LAPAROSCOPIC;  Surgeon: Emelia Loron, MD;  Location: [facility] OR;  Service: General;  Laterality: N/A;   SHOULDER SURGERY Bilateral     Family History  Problem Relation Age of Onset   Heart disease Mother    Diabetes Sister        Type II   Glaucoma Maternal Grandmother    Colon cancer Neg Hx    Stomach cancer Neg Hx    Esophageal cancer Neg Hx    Rectal cancer Neg Hx    Colon polyps Neg Hx      Social History   Socioeconomic History   Marital status: Married    Spouse name: Not on file   Number of children: Not on file   Years of education: Not on file   Highest education level: Not on file  Occupational History   Not on file  Tobacco Use   Smoking status: Never   Smokeless tobacco: Never  Vaping Use   Vaping status: Never Used  Substance and Sexual Activity   Alcohol use: Yes    Comment: occ   Drug use: No   Sexual activity: Yes  Other Topics Concern   Not on file  Social History Narrative   Not on file   Social Drivers of Health   Financial Resource Strain: Not on file  Food Insecurity: Not on file  Transportation Needs: Not on file  Physical Activity: Not on file  Stress: Not on file  Social Connections: Not on file  Intimate Partner Violence: Not on file    Outpatient Medications Prior to Visit  Medication Sig Dispense Refill   benazepril (LOTENSIN) 10 MG tablet Take 1 tablet (10 mg total) by mouth daily. 30 tablet 0   fexofenadine (ALLEGRA) 180 MG tablet Take 180 mg by mouth daily. OTC OFF brand     fish oil-omega-3 fatty acids 1000 MG capsule Take 1 g by mouth 2 (two) times daily.  sildenafil (VIAGRA) 100 MG tablet TAKE 1/2 TO 1 (ONE-HALF TO ONE) TABLET BY MOUTH ONCE DAILY AS NEEDED FOR ERECTILE DYSFUNCTION 90 tablet 0   Vitamin D, Ergocalciferol, (DRISDOL) 1.25 MG (50000 UNIT) CAPS capsule Take 50,000 Units by mouth every 7 (seven) days.     No facility-administered medications prior to visit.    No Known Allergies   Social History   Tobacco Use   Smoking status: Never   Smokeless tobacco: Never  Vaping Use   Vaping status: Never Used  Substance Use Topics   Alcohol use: Yes    Comment: occ   Drug use: No     Review of Systems  Constitutional:  Negative for chills and fever.  HENT:  Negative for ear pain and sore throat.   Respiratory:  Negative for shortness of breath.      Labs/Other Tests and Data Reviewed:    Recent  Labs: 12/09/2023: ALT 34; BUN 15; Creat 0.95; Hemoglobin 14.9; Platelets 173; Potassium 4.5; Sodium 139   Recent Lipid Panel Lab Results  Component Value Date/Time   CHOL 234 (H) 12/09/2023 08:23 AM   TRIG 147 12/09/2023 08:23 AM   HDL 50 12/09/2023 08:23 AM   CHOLHDL 4.7 12/09/2023 08:23 AM   LDLCALC 157 (H) 12/09/2023 08:23 AM    Wt Readings from Last 3 Encounters:  12/11/23 240 lb 12.8 oz (109.2 kg)  12/04/22 225 lb (102.1 kg)  11/01/22 225 lb (102.1 kg)     Objective:    Vital Signs:  There were no vitals taken for this visit.   Physical Exam Constitutional:      General: He is not in acute distress.    Appearance: He is not ill-appearing.  HENT:     Nose:     Comments: Sounds very congested when speaking.      ASSESSMENT & PLAN:   Acute non-recurrent sinusitis of other sinus Assessment & Plan: Symptoms of congestion, cough, and sinus pressure. No history of asthma or COPD. No response to over-the-counter decongestants. -Prescribe Amoxicillin. -Recommend Mucinex (guaifenesin) for cough. -Prescribe Flonase nasal spray for congestion.  Orders: -     Amoxicillin; Take 1 tablet (875 mg total) by mouth 2 (two) times daily.  Dispense: 20 tablet; Refill: 0 -     Fluticasone Propionate; Place 2 sprays into both nostrils daily.  Dispense: 16 g; Refill: 0  Acute bronchitis due to other specified organisms Assessment & Plan: Symptoms of congestion, cough, and sinus pressure. No history of asthma or COPD. No response to over-the-counter decongestants. -Prescribe Amoxicillin. -Recommend Mucinex (guaifenesin) for cough. -Prescribe Flonase nasal spray for congestion.  Orders: -     Amoxicillin; Take 1 tablet (875 mg total) by mouth 2 (two) times daily.  Dispense: 20 tablet; Refill: 0 -     Fluticasone Propionate; Place 2 sprays into both nostrils daily.  Dispense: 16 g; Refill: 0     No orders of the defined types were placed in this encounter.    Meds ordered this  encounter  Medications   amoxicillin (AMOXIL) 875 MG tablet    Sig: Take 1 tablet (875 mg total) by mouth 2 (two) times daily.    Dispense:  20 tablet    Refill:  0   fluticasone (FLONASE) 50 MCG/ACT nasal spray    Sig: Place 2 sprays into both nostrils daily.    Dispense:  16 g    Refill:  0    I spent 7 minutes dedicated to the care of  this patient on the date of this encounter to include face-to-face time with the patient.  Follow Up:  In Person prn  Signed, Blane Ohara, MD  12/15/2023 11:00 AM    [place] [facility]

## 2023-12-15 NOTE — Patient Instructions (Signed)
VISIT SUMMARY:  You were seen today for symptoms of sinus congestion and a persistent cough. You have a history of hypertension and hyperlipidemia, but no acute issues related to these conditions were discussed during this visit.  YOUR PLAN:  -SINUSITIS AND BRONCHITIS: Sinusitis is an inflammation of the sinuses causing congestion and pressure, while bronchitis is an inflammation of the bronchial tubes leading to coughing. You have been prescribed Amoxicillin to treat the infection, Mucinex (guaifenesin) to help with the cough, and Flonase nasal spray to relieve congestion.   INSTRUCTIONS:  If your symptoms persist or worsen, please contact Dr. Caren Macadam office or call back for further management.

## 2023-12-15 NOTE — Telephone Encounter (Signed)
  Chief Complaint: non productive cough Symptoms: dry cough, sinus pain and congestion, wheezing in chest at night when lying flat Frequency: x 3 days Pertinent Negatives: Patient denies body aches, fever, earaches, difficulty breathing, chest pain Disposition: [] ED /[] Urgent Care (no appt availability in office) / [x] Appointment(In office/virtual)/ []  [facility]/ [] Home Care/ [] Refused Recommended Disposition /[] [place] [facility]/ []  Follow-up with PCP Additional Notes: Patient states he has taken OTC Tylenol cold and flu medication that has not helped. Home COVID test was negative. Patient requesting soonest available virtual visit.  Reason for Disposition  [1] Continuous (nonstop) coughing interferes with work or school AND [2] no improvement using cough treatment per Care Advice  Answer Assessment - Initial Assessment Questions 1. ONSET: "When did the cough begin?"      Since Saturday (12/13/23).  2. SEVERITY: "How bad is the cough today?"      Patient states the cough gets worse in the evening and at night.  3. SPUTUM: "Describe the color of your sputum" (none, dry cough; clear, white, yellow, green)     Dry cough.  4. HEMOPTYSIS: "Are you coughing up any blood?" If so ask: "How much?" (flecks, streaks, tablespoons, etc.)     Denies.  5. DIFFICULTY BREATHING: "Are you having difficulty breathing?" If Yes, ask: "How bad is it?" (e.g., mild, moderate, severe)    - MILD: No SOB at rest, mild SOB with walking, speaks normally in sentences, can lie down, no retractions, pulse < 100.    - MODERATE: SOB at rest, SOB with minimal exertion and prefers to sit, cannot lie down flat, speaks in phrases, mild retractions, audible wheezing, pulse 100-120.    - SEVERE: Very SOB at rest, speaks in single words, struggling to breathe, sitting hunched forward, retractions, pulse > 120      Denies difficulty breathing.  6. FEVER: "Do you have a fever?" If Yes, ask: "What is your  temperature, how was it measured, and when did it start?"     Denies.  7. CARDIAC HISTORY: "Do you have any history of heart disease?" (e.g., heart attack, congestive heart failure)      Denies.  8. LUNG HISTORY: "Do you have any history of lung disease?"  (e.g., pulmonary embolus, asthma, emphysema)     Denies.  9. PE RISK FACTORS: "Do you have a history of blood clots?" (or: recent major surgery, recent prolonged travel, bedridden)     Denies.  10. OTHER SYMPTOMS: "Do you have any other symptoms?" (e.g., runny nose, wheezing, chest pain)       He states wheezing in chest last night when lying down, headache, nasal congestion.  11. TRAVEL: "Have you traveled out of the country in the last month?" (e.g., travel history, exposures)       Denies travel; denies known exposures.  Protocols used: Cough - Acute Non-Productive-A-AH

## 2023-12-15 NOTE — Assessment & Plan Note (Signed)
Symptoms of congestion, cough, and sinus pressure. No history of asthma or COPD. No response to over-the-counter decongestants. -Prescribe Amoxicillin. -Recommend Mucinex (guaifenesin) for cough. -Prescribe Flonase nasal spray for congestion.

## 2023-12-17 ENCOUNTER — Encounter: Payer: Self-pay | Admitting: Family Medicine

## 2023-12-17 ENCOUNTER — Other Ambulatory Visit: Payer: Self-pay

## 2023-12-17 MED ORDER — BENZONATATE 100 MG PO CAPS: 100.0000 mg | ORAL_CAPSULE | Freq: Two times a day (BID) | ORAL | 0 refills | Status: AC | PRN

## 2023-12-17 MED ORDER — HYDROCODONE BIT-HOMATROP MBR 5-1.5 MG/5ML PO SOLN: 5.0000 mL | Freq: Every evening | ORAL | 0 refills | Status: AC | PRN

## 2023-12-26 ENCOUNTER — Other Ambulatory Visit: Payer: Self-pay | Admitting: Family Medicine

## 2023-12-26 DIAGNOSIS — I10 Essential (primary) hypertension: Secondary | ICD-10-CM

## 2023-12-26 NOTE — Telephone Encounter (Signed)
Requested medication (s) are due for refill today: yes   Requested medication (s) are on the active medication list: yes   Last refill:  11/28/23 #30 0 refills   Future visit scheduled: no   Notes to clinic:  last OV 1/30//25. No refills remain. Do you want to refill Rx and give more refills?     Requested Prescriptions  Pending Prescriptions Disp Refills   benazepril (LOTENSIN) 10 MG tablet [Pharmacy Med Name: BENAZEPRIL 10MG  TABLETS] 30 tablet 0    Sig: TAKE 1 TABLET(10 MG) BY MOUTH DAILY     Cardiovascular:  ACE Inhibitors Failed - 12/26/2023  9:29 AM      Failed - Valid encounter within last 6 months    Recent Outpatient Visits           2 years ago Routine general medical examination at a health care facility   [facility] Pickard, Priscille Heidelberg, MD   3 years ago Routine general medical examination at a health care facility   [facility] Donita Brooks, MD   4 years ago URI, acute   [facility] Donita Brooks, MD   5 years ago Routine general medical examination at a health care facility   [facility] Pickard, Priscille Heidelberg, MD   5 years ago Viral conjunctivitis of right eye   [facility] Danelle Berry, PA-C              Passed - Cr in normal range and within 180 days    Creat  Date Value Ref Range Status  12/09/2023 0.95 0.70 - 1.30 mg/dL Final         Passed - K in normal range and within 180 days    Potassium  Date Value Ref Range Status  12/09/2023 4.5 3.5 - 5.3 mmol/L Final         Passed - Patient is not pregnant      Passed - Last BP in normal range    BP Readings from Last 1 Encounters:  12/11/23 132/78

## 2024-01-01 ENCOUNTER — Other Ambulatory Visit: Payer: Self-pay | Admitting: Family Medicine

## 2024-01-01 DIAGNOSIS — I10 Essential (primary) hypertension: Secondary | ICD-10-CM

## 2024-01-01 MED ORDER — BENAZEPRIL HCL 10 MG PO TABS: 10.0000 mg | ORAL_TABLET | Freq: Every day | ORAL | 3 refills | Status: DC

## 2024-01-01 NOTE — Telephone Encounter (Signed)
 Copied from CRM [id number]. Topic: Clinical - Medication Refill >> Jan 01, 2024  1:15 PM Gildardo Pounds wrote: Most Recent Primary Care Visit:  Provider: [facility]  Department: [facility]  Visit Type: ACUTE  Date: 12/15/2023  Medication: benazepril (LOTENSIN) 10 MG tablet  Has the patient contacted their pharmacy? Yes (Agent: If no, request that the patient contact the pharmacy for the refill. If patient does not wish to contact the pharmacy document the reason why and proceed with request.) (Agent: If yes, when and what did the pharmacy advise?)Message from pharmacy said approval not received from provider.  Is this the correct pharmacy for this prescription? Yes If no, delete pharmacy and type the correct one.  This is the patient's preferred pharmacy:  [id number] [place], [place] - [address] AT [facility] [address] [facility] [address] [place] [address] Phone: [phone] Fax: [phone]     Has the prescription been filled recently? No  Is the patient out of the medication? Yes  Has the patient been seen for an appointment in the last year OR does the patient have an upcoming appointment? Yes  Can we respond through MyChart? No  Agent: Please be advised that Rx refills may take up to 3 business days. We ask that you follow-up with your pharmacy.

## 2024-01-01 NOTE — Telephone Encounter (Signed)
 Copied from CRM [id number]. Topic: Clinical - Medication Question >> Jan 01, 2024  1:18 PM Archie Patten S wrote: Reason for CRM: Patient is requesting the refill for benazepril (LOTENSIN) 10 MG tablet be sent for more than once month as previously prescribed. Callback number is [phone]

## 2024-02-13 ENCOUNTER — Ambulatory Visit: Payer: Self-pay

## 2024-02-13 DIAGNOSIS — Z23 Encounter for immunization: Secondary | ICD-10-CM | POA: Diagnosis not present

## 2024-02-13 NOTE — Progress Notes (Signed)
 Patient is in office today for a nurse visit for Shingles Immunization. Patient Injection was given in the  Right deltoid. Patient tolerated injection well.   Renee Pain, CMA

## 2024-05-03 ENCOUNTER — Other Ambulatory Visit: Payer: Self-pay | Admitting: Family Medicine

## 2024-05-03 DIAGNOSIS — I10 Essential (primary) hypertension: Secondary | ICD-10-CM

## 2024-08-19 DIAGNOSIS — M545 Low back pain, unspecified: Secondary | ICD-10-CM | POA: Insufficient documentation

## 2024-09-04 ENCOUNTER — Other Ambulatory Visit: Payer: Self-pay | Admitting: Family Medicine

## 2024-09-04 DIAGNOSIS — I10 Essential (primary) hypertension: Secondary | ICD-10-CM

## 2024-09-07 NOTE — Telephone Encounter (Signed)
 Requested Prescriptions  Pending Prescriptions Disp Refills   benazepril  (LOTENSIN ) 10 MG tablet [Pharmacy Med Name: BENAZEPRIL  10MG  TABLETS] 90 tablet 0    Sig: TAKE 1 TABLET(10 MG) BY MOUTH DAILY     Cardiovascular:  ACE Inhibitors Failed - 09/07/2024  9:40 AM      Failed - Cr in normal range and within 180 days    Creat  Date Value Ref Range Status  12/09/2023 0.95 0.70 - 1.30 mg/dL Final         Failed - K in normal range and within 180 days    Potassium  Date Value Ref Range Status  12/09/2023 4.5 3.5 - 5.3 mmol/L Final         Failed - Valid encounter within last 6 months    Recent Outpatient Visits           9 months ago Routine general medical examination at a health care facility   [facility] Duanne Butler DASEN, MD   1 year ago Need for immunization against influenza    [facility] Duanne Butler DASEN, MD              Passed - Patient is not pregnant      Passed - Last BP in normal range    BP Readings from Last 1 Encounters:  12/11/23 132/78

## 2024-11-12 ENCOUNTER — Telehealth: Payer: Self-pay

## 2024-11-12 NOTE — Telephone Encounter (Signed)
 Copied from CRM #[id number]. Topic: Clinical - Request for Lab/Test Order >> Nov 12, 2024 11:37 AM Delon HERO wrote: Reason for CRM: Patient is calling to request orders to be placed prior to 12/16/24 for CPE. Please advise

## 2024-12-06 ENCOUNTER — Other Ambulatory Visit: Payer: Self-pay | Admitting: Family Medicine

## 2024-12-06 DIAGNOSIS — I10 Essential (primary) hypertension: Secondary | ICD-10-CM

## 2024-12-07 ENCOUNTER — Other Ambulatory Visit: Payer: Self-pay | Admitting: Family Medicine

## 2024-12-07 ENCOUNTER — Other Ambulatory Visit

## 2024-12-07 DIAGNOSIS — I10 Essential (primary) hypertension: Secondary | ICD-10-CM

## 2024-12-07 NOTE — Telephone Encounter (Unsigned)
 Copied from CRM #[id number]. Topic: Clinical - Medication Refill >> Dec 07, 2024 11:26 AM Darshell M wrote: Medication:  benazepril  (LOTENSIN ) 10 MG tablet - Patient requesting 90 day supply with refills    Has the patient contacted their pharmacy? Yes (Agent: If no, request that the patient contact the pharmacy for the refill. If patient does not wish to contact the pharmacy document the reason why and proceed with request.) (Agent: If yes, when and what did the pharmacy advise?)  This is the patient's preferred pharmacy:  [id number] [address], [place] - [address] AT [facility] OF [address] & [facility] [address] [place] [address] Phone: [phone] Fax: [phone]  Is this the correct pharmacy for this prescription? Yes If no, delete pharmacy and type the correct one.   Has the prescription been filled recently? No  Is the patient out of the medication? Yes  Has the patient been seen for an appointment in the last year OR does the patient have an upcoming appointment? Yes  Can we respond through MyChart? Yes  Agent: Please be advised that Rx refills may take up to 3 business days. We ask that you follow-up with your pharmacy.

## 2024-12-07 NOTE — Telephone Encounter (Signed)
 Requesting 90 day supply.

## 2024-12-08 ENCOUNTER — Other Ambulatory Visit

## 2024-12-08 DIAGNOSIS — Z125 Encounter for screening for malignant neoplasm of prostate: Secondary | ICD-10-CM

## 2024-12-08 DIAGNOSIS — E7800 Pure hypercholesterolemia, unspecified: Secondary | ICD-10-CM

## 2024-12-08 DIAGNOSIS — I10 Essential (primary) hypertension: Secondary | ICD-10-CM

## 2024-12-08 DIAGNOSIS — Z13220 Encounter for screening for lipoid disorders: Secondary | ICD-10-CM

## 2024-12-08 NOTE — Telephone Encounter (Signed)
 Duplicate request, refilled 12/07/24.  Requested Prescriptions  Pending Prescriptions Disp Refills   benazepril  (LOTENSIN ) 10 MG tablet 90 tablet 0     Cardiovascular:  ACE Inhibitors Failed - 12/08/2024 11:08 AM      Failed - Cr in normal range and within 180 days    Creat  Date Value Ref Range Status  12/09/2023 0.95 0.70 - 1.30 mg/dL Final         Failed - K in normal range and within 180 days    Potassium  Date Value Ref Range Status  12/09/2023 4.5 3.5 - 5.3 mmol/L Final         Failed - Valid encounter within last 6 months    Recent Outpatient Visits           12 months ago Routine general medical examination at a health care facility   [facility] Duanne Butler DASEN, MD   2 years ago Need for immunization against influenza   [facility] Duanne Butler DASEN, MD              Passed - Patient is not pregnant      Passed - Last BP in normal range    BP Readings from Last 1 Encounters:  12/11/23 132/78

## 2024-12-09 ENCOUNTER — Ambulatory Visit: Payer: Self-pay | Admitting: Family Medicine

## 2024-12-11 LAB — CBC WITH DIFFERENTIAL/PLATELET
Absolute Lymphocytes: 1709 {cells}/uL (ref 850–3900)
Absolute Monocytes: 627 {cells}/uL (ref 200–950)
Basophils Absolute: 51 {cells}/uL (ref 0–200)
Basophils Relative: 0.8 %
Eosinophils Absolute: 192 {cells}/uL (ref 15–500)
Eosinophils Relative: 3 %
HCT: 44.4 % (ref 39.4–51.1)
Hemoglobin: 14.9 g/dL (ref 13.2–17.1)
MCH: 32.4 pg (ref 27.0–33.0)
MCHC: 33.6 g/dL (ref 31.6–35.4)
MCV: 96.5 fL (ref 81.4–101.7)
MPV: 10.8 fL (ref 7.5–12.5)
Monocytes Relative: 9.8 %
Neutro Abs: 3821 {cells}/uL (ref 1500–7800)
Neutrophils Relative %: 59.7 %
Platelets: 217 10*3/uL (ref 140–400)
RBC: 4.6 Million/uL (ref 4.20–5.80)
RDW: 12.7 % (ref 11.0–15.0)
Total Lymphocyte: 26.7 %
WBC: 6.4 10*3/uL (ref 3.8–10.8)

## 2024-12-11 LAB — COMPLETE METABOLIC PANEL WITHOUT GFR
AG Ratio: 1.8 (calc) (ref 1.0–2.5)
ALT: 39 U/L (ref 9–46)
AST: 26 U/L (ref 10–35)
Albumin: 4.4 g/dL (ref 3.6–5.1)
Alkaline phosphatase (APISO): 51 U/L (ref 35–144)
BUN: 14 mg/dL (ref 7–25)
CO2: 28 mmol/L (ref 20–32)
Calcium: 9.7 mg/dL (ref 8.6–10.3)
Chloride: 101 mmol/L (ref 98–110)
Creat: 1 mg/dL (ref 0.70–1.30)
Globulin: 2.5 g/dL (ref 1.9–3.7)
Glucose, Bld: 126 mg/dL — ABNORMAL HIGH (ref 65–99)
Potassium: 4.2 mmol/L (ref 3.5–5.3)
Sodium: 139 mmol/L (ref 135–146)
Total Bilirubin: 0.7 mg/dL (ref 0.2–1.2)
Total Protein: 6.9 g/dL (ref 6.1–8.1)

## 2024-12-11 LAB — LIPID PANEL
Cholesterol: 220 mg/dL — ABNORMAL HIGH
HDL: 62 mg/dL
LDL Cholesterol (Calc): 138 mg/dL — ABNORMAL HIGH
Non-HDL Cholesterol (Calc): 158 mg/dL — ABNORMAL HIGH
Total CHOL/HDL Ratio: 3.5 (calc)
Triglycerides: 101 mg/dL

## 2024-12-11 LAB — HEMOGLOBIN A1C
Hgb A1c MFr Bld: 6 % — ABNORMAL HIGH
Mean Plasma Glucose: 126 mg/dL
eAG (mmol/L): 7 mmol/L

## 2024-12-11 LAB — TEST AUTHORIZATION

## 2024-12-11 LAB — PSA: PSA: 0.56 ng/mL

## 2024-12-16 ENCOUNTER — Ambulatory Visit: Admitting: Family Medicine

## 2024-12-16 ENCOUNTER — Encounter: Payer: Self-pay | Admitting: Family Medicine

## 2024-12-16 VITALS — BP 130/80 | HR 100 | Temp 98.2°F | Ht 72.0 in | Wt 236.2 lb

## 2024-12-16 DIAGNOSIS — Z0001 Encounter for general adult medical examination with abnormal findings: Secondary | ICD-10-CM

## 2024-12-16 DIAGNOSIS — Z0000 Encounter for general adult medical examination without abnormal findings: Secondary | ICD-10-CM

## 2024-12-16 DIAGNOSIS — I10 Essential (primary) hypertension: Secondary | ICD-10-CM

## 2024-12-16 DIAGNOSIS — E7800 Pure hypercholesterolemia, unspecified: Secondary | ICD-10-CM

## 2024-12-16 DIAGNOSIS — R7303 Prediabetes: Secondary | ICD-10-CM

## 2024-12-16 NOTE — Progress Notes (Signed)
 "  Subjective:    Patient ID: Evan Strickland, male    DOB: 09-Apr-1966, 59 y.o.   MRN: [id number]  HPI Patient is a very pleasant 59 year old Caucasian gentleman here today for a physical exam.  He has a history of hyperlipidemia on his lab work.  2022, we ordered a coronary artery calcium score.  His score was 2 in the LAD and 0 and all of his other coronary arteries ranking him in the 48th percentile.  Last colonoscopy was 2024, due to several polyps, they recommended a repeat colonoscopy in 2027.  Patient exercises every day.  He states that he gets 18,000 steps on a daily basis with walking.  However he admits that he is eating a lot of carbohydrates.  Since his lab work was obtained, he has made a conscious effort to try to decrease the carbs in his diet.  He is due for a pneumonia shot.  He has had his flu shot and his shingles shot. Lab on 12/08/2024  Component Date Value Ref Range Status   WBC 12/08/2024 6.4  3.8 - 10.8 Thousand/uL Final   RBC 12/08/2024 4.60  4.20 - 5.80 Million/uL Final   Hemoglobin 12/08/2024 14.9  13.2 - 17.1 g/dL Final   HCT 98/71/7973 44.4  39.4 - 51.1 % Final   MCV 12/08/2024 96.5  81.4 - 101.7 fL Final   MCH 12/08/2024 32.4  27.0 - 33.0 pg Final   MCHC 12/08/2024 33.6  31.6 - 35.4 g/dL Final   RDW 98/71/7973 12.7  11.0 - 15.0 % Final   Platelets 12/08/2024 217  140 - 400 Thousand/uL Final   MPV 12/08/2024 10.8  7.5 - 12.5 fL Final   Neutro Abs 12/08/2024 3,821  1,500 - 7,800 cells/uL Final   Absolute Lymphocytes 12/08/2024 1,709  850 - 3,900 cells/uL Final   Absolute Monocytes 12/08/2024 627  200 - 950 cells/uL Final   Eosinophils Absolute 12/08/2024 192  15 - 500 cells/uL Final   Basophils Absolute 12/08/2024 51  0 - 200 cells/uL Final   Neutrophils Relative % 12/08/2024 59.7  % Final   Total Lymphocyte 12/08/2024 26.7  % Final   Monocytes Relative 12/08/2024 9.8  % Final   Eosinophils Relative 12/08/2024 3.0  % Final   Basophils Relative 12/08/2024 0.8  %  Final   Glucose, Bld 12/08/2024 126 (H)  65 - 99 mg/dL Final   Comment: .            Fasting reference interval . For someone without known diabetes, a glucose value >125 mg/dL indicates that they may have diabetes and this should be confirmed with a follow-up test. .    BUN 12/08/2024 14  7 - 25 mg/dL Final   Creat 98/71/7973 1.00  0.70 - 1.30 mg/dL Final   BUN/Creatinine Ratio 12/08/2024 SEE NOTE:  6 - 22 (calc) Final   Comment:    Not Reported: BUN and Creatinine are within    reference range. .    Sodium 12/08/2024 139  135 - 146 mmol/L Final   Potassium 12/08/2024 4.2  3.5 - 5.3 mmol/L Final   Chloride 12/08/2024 101  98 - 110 mmol/L Final   CO2 12/08/2024 28  20 - 32 mmol/L Final   Calcium 12/08/2024 9.7  8.6 - 10.3 mg/dL Final   Total Protein 98/71/7973 6.9  6.1 - 8.1 g/dL Final   Albumin 98/71/7973 4.4  3.6 - 5.1 g/dL Final   Globulin 98/71/7973 2.5  1.9 - 3.7 g/dL (calc)  Final   AG Ratio 12/08/2024 1.8  1.0 - 2.5 (calc) Final   Total Bilirubin 12/08/2024 0.7  0.2 - 1.2 mg/dL Final   Alkaline phosphatase (APISO) 12/08/2024 51  35 - 144 U/L Final   AST 12/08/2024 26  10 - 35 U/L Final   ALT 12/08/2024 39  9 - 46 U/L Final   Cholesterol 12/08/2024 220 (H)  <200 mg/dL Final   HDL 98/71/7973 62  > OR = 40 mg/dL Final   Triglycerides 98/71/7973 101  <150 mg/dL Final   LDL Cholesterol (Calc) 12/08/2024 138 (H)  mg/dL (calc) Final   Comment: Reference range: <100 . Desirable range <100 mg/dL for primary prevention;   <70 mg/dL for patients with CHD or diabetic patients  with > or = 2 CHD risk factors. SABRA LDL-C is now calculated using the Martin-Hopkins  calculation, which is a validated novel method providing  better accuracy than the Friedewald equation in the  estimation of LDL-C.  Gladis APPLETHWAITE et al. SANDREA. 7986;689(80): 2061-2068  (http://education.[facility].com/faq/FAQ164)    Total CHOL/HDL Ratio 12/08/2024 3.5  <4.9 (calc) Final   Non-HDL Cholesterol (Calc)  12/08/2024 158 (H)  <130 mg/dL (calc) Final   Comment: For patients with diabetes plus 1 major ASCVD risk  factor, treating to a non-HDL-C goal of <100 mg/dL  (LDL-C of <29 mg/dL) is considered a therapeutic  option.    PSA 12/08/2024 0.56  < OR = 4.00 ng/mL Final   Comment: The total PSA value from this assay system is  standardized against the WHO standard. The test  result will be approximately 20% lower when compared  to the equimolar-standardized total PSA (Beckman  Coulter). Comparison of serial PSA results should be  interpreted with this fact in mind. . This test was performed using the Siemens  chemiluminescent method. Values obtained from  different assay methods cannot be used interchangeably. PSA levels, regardless of value, should not be interpreted as absolute evidence of the presence or absence of disease.    Hgb A1c MFr Bld 12/08/2024 6.0 (H)  <5.7 % Final   Comment: For someone without known diabetes, a hemoglobin  A1c value between 5.7% and 6.4% is consistent with prediabetes and should be confirmed with a  follow-up test. . For someone with known diabetes, a value <7% indicates that their diabetes is well controlled. A1c targets should be individualized based on duration of diabetes, age, comorbid conditions, and other considerations. . This assay result is consistent with an increased risk of diabetes. . Currently, no consensus exists regarding use of hemoglobin A1c for diagnosis of diabetes for children. .    Mean Plasma Glucose 12/08/2024 126  mg/dL Final   eAG (mmol/L) 98/71/7973 7.0  mmol/L Final   TEST NAME: 12/08/2024 HEMOGLOBIN A1c WITH eAG   Final   TEST CODE: 12/08/2024 [id number]   Final   CLIENT CONTACT: 12/08/2024 SUZEN RUMPS   Final   REPORT ALWAYS MESSAGE SIGNATURE 12/08/2024    Final   Comment: . The laboratory testing on this patient was verbally requested or confirmed by the ordering physician or his or her  authorized representative after contact with an employee of [employer]. Federal regulations require that we maintain on file written authorization for all laboratory testing.  Accordingly we are asking that the ordering physician or his or her authorized representative sign a copy of this report and promptly return it to the client service representative. . . Signature:____________________________________________________ . Please fax this signed page to [phone] or return  it via your [employer] courier.     Past Medical History:  Diagnosis Date   Hyperlipidemia    Hypertension    Past Surgical History:  Procedure Laterality Date   APPENDECTOMY  04/01/13   DECOMPRESSION FASCIOTOMY LEG Right    LAPAROSCOPIC APPENDECTOMY N/A 04/01/2013   Procedure: APPENDECTOMY LAPAROSCOPIC;  Surgeon: Donnice Bury, MD;  Location: [facility] OR;  Service: General;  Laterality: N/A;   SHOULDER SURGERY Bilateral    Current Outpatient Medications on File Prior to Visit  Medication Sig Dispense Refill   amoxicillin  (AMOXIL ) 875 MG tablet Take 1 tablet (875 mg total) by mouth 2 (two) times daily. 20 tablet 0   benazepril  (LOTENSIN ) 10 MG tablet TAKE 1 TABLET(10 MG) BY MOUTH DAILY 90 tablet 0   benzonatate  (TESSALON ) 100 MG capsule Take 1 capsule (100 mg total) by mouth 2 (two) times daily as needed for cough. 30 capsule 0   fexofenadine (ALLEGRA) 180 MG tablet Take 180 mg by mouth daily. OTC OFF brand     fish oil-omega-3 fatty acids 1000 MG capsule Take 1 g by mouth 2 (two) times daily.     fluticasone  (FLONASE ) 50 MCG/ACT nasal spray Place 2 sprays into both nostrils daily. 16 g 0   HYDROcodone  bit-homatropine (HYCODAN) 5-1.5 MG/5ML syrup Take 5 mLs by mouth at bedtime as needed for cough. 120 mL 0   sildenafil  (VIAGRA ) 100 MG tablet TAKE 1/2 TO 1 (ONE-HALF TO ONE) TABLET BY MOUTH ONCE DAILY AS NEEDED FOR ERECTILE DYSFUNCTION 90 tablet 0   Vitamin D, Ergocalciferol, (DRISDOL) 1.25 MG  (50000 UNIT) CAPS capsule Take 50,000 Units by mouth every 7 (seven) days.     No current facility-administered medications on file prior to visit.   No Known Allergies Social History   Socioeconomic History   Marital status: Married    Spouse name: Not on file   Number of children: Not on file   Years of education: Not on file   Highest education level: Bachelor's degree (e.g., BA, AB, BS)  Occupational History   Not on file  Tobacco Use   Smoking status: Never   Smokeless tobacco: Never  Vaping Use   Vaping status: Never Used  Substance and Sexual Activity   Alcohol use: Yes    Comment: occ   Drug use: No   Sexual activity: Yes  Other Topics Concern   Not on file  Social History Narrative   Not on file   Social Drivers of Health   Tobacco Use: Low Risk (12/14/2024)   Patient History    Smoking Tobacco Use: Never    Smokeless Tobacco Use: Never    Passive Exposure: Not on file  Financial Resource Strain: Low Risk (12/14/2024)   Overall Financial Resource Strain (CARDIA)    Difficulty of Paying Living Expenses: Not hard at all  Food Insecurity: No Food Insecurity (12/14/2024)   Epic    Worried About Radiation Protection Practitioner of Food in the Last Year: Never true    Ran Out of Food in the Last Year: Never true  Transportation Needs: No Transportation Needs (12/14/2024)   Epic    Lack of Transportation (Medical): No    Lack of Transportation (Non-Medical): No  Physical Activity: Sufficiently Active (12/14/2024)   Exercise Vital Sign    Days of Exercise per Week: 5 days    Minutes of Exercise per Session: 40 min  Stress: No Stress Concern Present (12/14/2024)   [employer] of Occupational Health - Occupational Stress Questionnaire  Feeling of Stress: Not at all  Social Connections: Socially Integrated (12/14/2024)   Social Connection and Isolation Panel    Frequency of Communication with Friends and Family: More than three times a week    Frequency of Social Gatherings with Friends  and Family: More than three times a week    Attends Religious Services: More than 4 times per year    Active Member of Clubs or Organizations: Yes    Attends Engineer, Structural: More than 4 times per year    Marital Status: Married  Catering Manager Violence: Not on file  Depression (PHQ2-9): Low Risk (12/11/2023)   Depression (PHQ2-9)    PHQ-2 Score: 0  Alcohol Screen: Low Risk (12/14/2024)   Alcohol Screen    Last Alcohol Screening Score (AUDIT): 4  Housing: Low Risk (12/14/2024)   Epic    Unable to Pay for Housing in the Last Year: No    Number of Times Moved in the Last Year: 0    Homeless in the Last Year: No  Utilities: Not on file  Health Literacy: Not on file   Family History  Problem Relation Age of Onset   Heart disease Mother    Diabetes Sister        Type II   Glaucoma Maternal Grandmother    Colon cancer Neg Hx    Stomach cancer Neg Hx    Esophageal cancer Neg Hx    Rectal cancer Neg Hx    Colon polyps Neg Hx      Review of Systems  All other systems reviewed and are negative.      Objective:   Physical Exam Vitals reviewed.  Constitutional:      General: He is not in acute distress.    Appearance: He is well-developed. He is not diaphoretic.  HENT:     Head: Normocephalic and atraumatic.     Right Ear: External ear normal.     Left Ear: External ear normal.     Nose: Nose normal.     Mouth/Throat:     Pharynx: No oropharyngeal exudate.  Eyes:     General: No scleral icterus.       Right eye: No discharge.        Left eye: No discharge.     Conjunctiva/sclera: Conjunctivae normal.     Pupils: Pupils are equal, round, and reactive to light.  Neck:     Thyroid: No thyromegaly.     Vascular: No JVD.     Trachea: No tracheal deviation.  Cardiovascular:     Rate and Rhythm: Normal rate and regular rhythm.     Heart sounds: Normal heart sounds. No murmur heard.    No friction rub. No gallop.  Pulmonary:     Effort: Pulmonary effort is  normal. No respiratory distress.     Breath sounds: Normal breath sounds. No stridor. No wheezing or rales.  Chest:     Chest wall: No tenderness.  Abdominal:     General: Bowel sounds are normal. There is no distension.     Palpations: Abdomen is soft. There is no mass.     Tenderness: There is no abdominal tenderness. There is no guarding or rebound.  Genitourinary:    Penis: Normal.   Musculoskeletal:        General: No tenderness. Normal range of motion.     Cervical back: Normal range of motion and neck supple.  Lymphadenopathy:     Cervical: No cervical adenopathy.  Skin:    General: Skin is warm.     Coloration: Skin is not pale.     Findings: No erythema or rash.  Neurological:     Mental Status: He is alert and oriented to person, place, and time.     Cranial Nerves: No cranial nerve deficit.     Motor: No abnormal muscle tone.     Coordination: Coordination normal.     Deep Tendon Reflexes: Reflexes are normal and symmetric.  Psychiatric:        Behavior: Behavior normal.        Thought Content: Thought content normal.        Judgment: Judgment normal.     The 10-year ASCVD risk score (Arnett DK, et al., 2019) is: 7.4%   Values used to calculate the score:     Age: 68 years     Clinically relevant sex: Male     Is Non-Hispanic African American: No     Diabetic: No     Tobacco smoker: No     Systolic Blood Pressure: 130 mmHg     Is BP treated: Yes     HDL Cholesterol: 62 mg/dL     Total Cholesterol: 220 mg/dL       Assessment & Plan:  Routine general medical examination at a health care facility  Essential hypertension  Pure hypercholesterolemia  Prediabetes I am very happy with his blood pressure.  I recommended the pneumonia shot.  I recommended a low carbohydrate diet, less than 45 g of carbs per meal.  He is already getting regular aerobic exercise.  My biggest concern is his blood sugar as well as his cholesterol.  I would like to see his LDL  cholesterol less than 100.  Patient wants to work on diet and then recheck labs in 6 months prior to starting the statin.  I believe he can lower his sugar through dietary changes.  We will recheck his lab work in 6 months.  I recommended a pneumonia vaccine at his earliest convenience "

## 2025-06-15 ENCOUNTER — Other Ambulatory Visit
